# Patient Record
Sex: Male | Born: 1960 | Race: White | Hispanic: No | Marital: Single | Smoking: Current every day smoker
Health system: Southern US, Community
[De-identification: ages and names within clinical notes are randomized; demographics above are authoritative.]

## PROBLEM LIST (undated history)

## (undated) DIAGNOSIS — R911 Solitary pulmonary nodule: Secondary | ICD-10-CM

## (undated) DIAGNOSIS — R195 Other fecal abnormalities: Secondary | ICD-10-CM

## (undated) DIAGNOSIS — J439 Emphysema, unspecified: Secondary | ICD-10-CM

## (undated) HISTORY — PX: TONSILLECTOMY: SUR1361

## (undated) HISTORY — PX: HAND SURGERY: SHX662

## (undated) HISTORY — PX: SHOULDER SURGERY: SHX246

## (undated) HISTORY — DX: Solitary pulmonary nodule: R91.1

## (undated) HISTORY — DX: Other fecal abnormalities: R19.5

## (undated) HISTORY — DX: Emphysema, unspecified: J43.9

---

## 2012-01-04 ENCOUNTER — Encounter: Payer: Self-pay | Admitting: Osteopathic Medicine

## 2015-02-14 ENCOUNTER — Emergency Department (INDEPENDENT_AMBULATORY_CARE_PROVIDER_SITE_OTHER)
Admission: EM | Admit: 2015-02-14 | Discharge: 2015-02-14 | Disposition: A | Discharge status: Home / Self Care | Payer: BLUE CROSS/BLUE SHIELD | Source: Home / Self Care | Attending: Family Medicine | Admitting: Family Medicine

## 2015-02-14 ENCOUNTER — Encounter: Payer: Self-pay | Admitting: Emergency Medicine

## 2015-02-14 DIAGNOSIS — L03317 Cellulitis of buttock: Secondary | ICD-10-CM | POA: Diagnosis not present

## 2015-02-14 DIAGNOSIS — L03111 Cellulitis of right axilla: Secondary | ICD-10-CM

## 2015-02-14 DIAGNOSIS — Z23 Encounter for immunization: Secondary | ICD-10-CM | POA: Diagnosis not present

## 2015-02-14 DIAGNOSIS — L03112 Cellulitis of left axilla: Secondary | ICD-10-CM

## 2015-02-14 MED ORDER — DOXYCYCLINE HYCLATE 100 MG PO CAPS: 100.0000 mg | ORAL_CAPSULE | Freq: Two times a day (BID) | ORAL | Status: DC

## 2015-02-14 MED ORDER — INFLUENZA VAC SPLIT QUAD 0.5 ML IM SUSY
0.5000 mL | PREFILLED_SYRINGE | Freq: Once | INTRAMUSCULAR | Status: AC
  Administered 2015-02-14: 0.5 mL via INTRAMUSCULAR

## 2015-02-14 NOTE — ED Provider Notes (Signed)
CSN: [id number]     Arrival date & time 02/14/15  1047 History   First MD Initiated Contact with Patient 02/14/15 1116     Chief Complaint  Patient presents with  . Recurrent Skin Infections      HPI Comments: Patient reports that he had some axillary abscesses several months ago, resolved.  He has now developed a small tender spot in each axilla, and a sore area on his right medial buttock.  There has been no drainage, and no fevers, chills, and sweats.  Patient is a 54 y.o. male presenting with abscess. The history is provided by the patient.  Abscess Abscess location: both axillae and left buttock. Abscess quality: induration, painful and redness   Abscess quality: not draining, no fluctuance, no itching, no warmth and not weeping   Red streaking: no   Duration:  3 days Progression:  Worsening Pain details:    Quality:  Dull   Severity:  Mild   Duration:  3 days   Progression:  Worsening Chronicity:  Recurrent Context: not diabetes   Relieved by:  None tried Worsened by:  Nothing tried Ineffective treatments:  None tried Associated symptoms: no fatigue, no fever and no nausea   Risk factors: prior abscess     History reviewed. No pertinent past medical history. Past Surgical History  Procedure Laterality Date  . Tonsillectomy    . Shoulder surgery Right   . Hand surgery Left    History reviewed. No pertinent family history. Social History  Substance Use Topics  . Smoking status: Current Every Day Smoker  . Smokeless tobacco: Never Used  . Alcohol Use: Yes    Review of Systems  Constitutional: Negative for fever and fatigue.  Gastrointestinal: Negative for nausea.  All other systems reviewed and are negative.   Allergies  Review of patient's allergies indicates no known allergies.  Home Medications   Prior to Admission medications   Medication Sig Start Date End Date Taking? Authorizing Provider  aspirin 81 MG tablet Take 81 mg by mouth daily.   Yes  Historical Provider, MD  Multiple Vitamin (MULTIVITAMIN) capsule Take 1 capsule by mouth daily.   Yes Historical Provider, MD  doxycycline (VIBRAMYCIN) 100 MG capsule Take 1 capsule (100 mg total) by mouth 2 (two) times daily. Take with food. 02/14/15   Lattie Haw, MD   Meds Ordered and Administered this Visit  Medications - No data to display  BP 144/90 mmHg  Pulse 67  Temp(Src) 97.5 F (36.4 C) (Oral)  Resp 16  Ht 6' (1.829 m)  Wt 165 lb (74.844 kg)  BMI 22.37 kg/m2  SpO2 100% No data found.   Physical Exam  Constitutional: He is oriented to person, place, and time. He appears well-developed and well-nourished. No distress.  HENT:  Head: Normocephalic.  Mouth/Throat: Oropharynx is clear and moist.  Eyes: Conjunctivae are normal. Pupils are equal, round, and reactive to light.  Neck: Neck supple.  Cardiovascular: Normal heart sounds.   Pulmonary/Chest: Breath sounds normal.  Lymphadenopathy:    He has no cervical adenopathy.  Neurological: He is alert and oriented to person, place, and time.  Skin: Skin is warm and dry.     In each axilla is a 1cm diameter erythematous indurated area, tender to palpation but not fluctuant.  On the left buttock medially, as noted on diagram, is a 2cm diameter indurated tender erythematous area, not fluctuant.    Nursing note and vitals reviewed.   ED Course  Procedures  none  MDM   1. Cellulitis of left axilla   2. Cellulitis of axilla, right   3. Cellulitis of buttock, left    Begin doxycycline 100mg  BID for staph coverage. Begin warm soak or warm compress 3 to 4 times daily. Return if not improving about 4 days, or if symptoms worsen.  May need I & D    Lattie HawStephen A Seraphim Affinito, MD 02/19/15 1758

## 2015-02-14 NOTE — Discharge Instructions (Signed)
Begin warm soak or warm compress 3 to 4 times daily.   Cellulitis Cellulitis is an infection of the skin and the tissue beneath it. The infected area is usually red and tender. Cellulitis occurs most often in the arms and lower legs.  CAUSES  Cellulitis is caused by bacteria that enter the skin through cracks or cuts in the skin. The most common types of bacteria that cause cellulitis are staphylococci and streptococci. SIGNS AND SYMPTOMS   Redness and warmth.  Swelling.  Tenderness or pain.  Fever. DIAGNOSIS  Your health care provider can usually determine what is wrong based on a physical exam. Blood tests may also be done. TREATMENT  Treatment usually involves taking an antibiotic medicine. HOME CARE INSTRUCTIONS   Take your antibiotic medicine as directed by your health care provider. Finish the antibiotic even if you start to feel better.  Keep the infected arm or leg elevated to reduce swelling.  Apply a warm cloth to the affected area up to 4 times per day to relieve pain.  Take medicines only as directed by your health care provider.  Keep all follow-up visits as directed by your health care provider. SEEK MEDICAL CARE IF:   You notice red streaks coming from the infected area.  Your red area gets larger or turns dark in color.  Your bone or joint underneath the infected area becomes painful after the skin has healed.  Your infection returns in the same area or another area.  You notice a swollen bump in the infected area.  You develop new symptoms.  You have a fever. SEEK IMMEDIATE MEDICAL CARE IF:   You feel very sleepy.  You develop vomiting or diarrhea.  You have a general ill feeling (malaise) with muscle aches and pains.   This information is not intended to replace advice given to you by your health care provider. Make sure you discuss any questions you have with your health care provider.   Document Released: 12/29/2004 Document Revised:  12/10/2014 Document Reviewed: 06/06/2011 Elsevier Interactive Patient Education [employer] Elsevier Inc.

## 2015-02-14 NOTE — ED Notes (Signed)
Reports developing boils in both axilla and at gluteal fold over past few days. Had axillary boil few months ago, prior to current.

## 2015-02-20 ENCOUNTER — Telehealth: Payer: Self-pay | Admitting: *Deleted

## 2015-08-06 ENCOUNTER — Ambulatory Visit (INDEPENDENT_AMBULATORY_CARE_PROVIDER_SITE_OTHER): Payer: BLUE CROSS/BLUE SHIELD | Admitting: Family Medicine

## 2015-08-06 ENCOUNTER — Encounter: Payer: Self-pay | Admitting: Family Medicine

## 2015-08-06 VITALS — BP 142/81 | HR 76 | Wt 169.0 lb

## 2015-08-06 DIAGNOSIS — L723 Sebaceous cyst: Secondary | ICD-10-CM

## 2015-08-06 DIAGNOSIS — L089 Local infection of the skin and subcutaneous tissue, unspecified: Secondary | ICD-10-CM

## 2015-08-06 MED ORDER — DOXYCYCLINE HYCLATE 100 MG PO TABS: 100.0000 mg | ORAL_TABLET | Freq: Two times a day (BID) | ORAL | Status: DC

## 2015-08-06 NOTE — Patient Instructions (Signed)
Thank you for coming in today. Take doxycycline twice daily for infection.  Return as needed.   Perirectal Abscess An abscess is an infected area that contains a collection of pus. A perirectal abscess is an abscess that is near the opening of the anus or around the rectum. A perirectal abscess can cause a lot of pain, especially during bowel movements. CAUSES This condition is almost always caused by an infection that starts in an anal gland. RISK FACTORS This condition is more likely to develop in:  People with diabetes or inflammatory bowel disease.  People whose body defense system (immune system) is weak.  People who have anal sex.  People who have a sexually transmitted disease (STD).  People who have certain kinds of cancers, such as rectal carcinoma, leukemia, or lymphoma. SYMPTOMS The main symptom of this condition is pain. The pain may be a throbbing pain that gets worse during bowel movements. Other symptoms include:  Fever.  Swelling.  Redness.  Bleeding.  Constipation. DIAGNOSIS The condition is diagnosed with a physical exam. If the abscess is not visible, a health care provider may need to place a finger inside the rectum to find the abscess. Sometimes, imaging tests are done to determine the size and location of the abscess. These tests may include:  An ultrasound.  An MRI.  A CT scan. TREATMENT This condition is usually treated with incision and drainage surgery. Incision and drainage surgery involves making an incision over the abscess to drain the pus. Treatment may also involve antibiotic medicine, pain medicine, stool softeners, or laxatives. HOME CARE INSTRUCTIONS  Take medicines only as directed by your health care provider.  If you were prescribed an antibiotic, finish all of it even if you start to feel better.  To relieve pain, try sitting:  In a warm, shallow bath (sitz bath).  On a heating pad with the setting on low.  On an inflatable  donut-shaped cushion.  Follow any diet instructions as directed by your health care provider.  Keep all follow-up visits as directed by your health care provider. This is important. SEEK MEDICAL CARE IF:  Your abscess is bleeding.  You have pain, swelling, or redness that is getting worse.  You are constipated.  You feel ill.  You have muscle aches or chills.  You have a fever.  Your symptoms return after the abscess has healed.   This information is not intended to replace advice given to you by your health care provider. Make sure you discuss any questions you have with your health care provider.   Document Released: 03/18/2000 Document Revised: 12/10/2014 Document Reviewed: 01/29/2014 Elsevier Interactive Patient Education 2016 Elsevier Inc.   Epidermal Cyst An epidermal cyst is sometimes called a sebaceous cyst, epidermal inclusion cyst, or infundibular cyst. These cysts usually contain a substance that looks "pasty" or "cheesy" and may have a bad smell. This substance is a protein called keratin. Epidermal cysts are usually found on the face, neck, or trunk. They may also occur in the vaginal area or other parts of the genitalia of both men and women. Epidermal cysts are usually small, painless, slow-growing bumps or lumps that move freely under the skin. It is important not to try to pop them. This may cause an infection and lead to tenderness and swelling. CAUSES  Epidermal cysts may be caused by a deep penetrating injury to the skin or a plugged hair follicle, often associated with acne. SYMPTOMS  Epidermal cysts can become inflamed and cause:  Redness.  Tenderness.  Increased temperature of the skin over the bumps or lumps.  Grayish-white, bad smelling material that drains from the bump or lump. DIAGNOSIS  Epidermal cysts are easily diagnosed by your caregiver during an exam. Rarely, a tissue sample (biopsy) may be taken to rule out other conditions that may  resemble epidermal cysts. TREATMENT   Epidermal cysts often get better and disappear on their own. They are rarely ever cancerous.  If a cyst becomes infected, it may become inflamed and tender. This may require opening and draining the cyst. Treatment with antibiotics may be necessary. When the infection is gone, the cyst may be removed with minor surgery.  Small, inflamed cysts can often be treated with antibiotics or by injecting steroid medicines.  Sometimes, epidermal cysts become large and bothersome. If this happens, surgical removal in your caregiver's office may be necessary. HOME CARE INSTRUCTIONS  Only take over-the-counter or prescription medicines as directed by your caregiver.  Take your antibiotics as directed. Finish them even if you start to feel better. SEEK MEDICAL CARE IF:   Your cyst becomes tender, red, or swollen.  Your condition is not improving or is getting worse.  You have any other questions or concerns. MAKE SURE YOU:  Understand these instructions.  Will watch your condition.  Will get help right away if you are not doing well or get worse.   This information is not intended to replace advice given to you by your health care provider. Make sure you discuss any questions you have with your health care provider.   Document Released: 02/20/2004 Document Revised: 06/13/2011 Document Reviewed: 09/27/2010 Elsevier Interactive Patient Education [employer]

## 2015-08-06 NOTE — Progress Notes (Signed)
       Jeffery Callahan is a 55 y.o. male who [name] today for abscess. Patient has a painful tender area on his right gluteal cleft near his anus. This is been worsening over the last few days. He's tried some over-the-counter medicines for pain control which have not helped. He denies any fevers or chills nausea vomiting diarrhea or discharge. He denies any injury.   No past medical history on file. Past Surgical History  Procedure Laterality Date  . Tonsillectomy    . Shoulder surgery Right   . Hand surgery Left    Social History  Substance Use Topics  . Smoking status: Current Every Day Smoker  . Smokeless tobacco: Never Used  . Alcohol Use: Yes   family history is not on file.  ROS as above Medications: Current Outpatient Prescriptions  Medication Sig Dispense Refill  . ASHWAGANDHA PO Take by mouth.    Marland Kitchen. aspirin 81 MG tablet Take 81 mg by mouth daily.    . COCONUT OIL PO Take by mouth.    Marland Kitchen. KRILL OIL PO Take by mouth.    . Multiple Vitamin (MULTIVITAMIN) capsule Take 1 capsule by mouth daily.    Marland Kitchen. doxycycline (VIBRA-TABS) 100 MG tablet Take 1 tablet (100 mg total) by mouth 2 (two) times daily. 14 tablet 0   No current facility-administered medications for this visit.   No Known Allergies   Exam:  BP 142/81 mmHg  Pulse 76  Wt 169 lb (76.658 kg) Gen: Well NAD HEENT: EOMI,  MMM Lungs: Normal work of breathing. CTABL Heart: RRR no MRG Abd: NABS, Soft. Nondistended, Nontender Exts: Brisk capillary refill, warm and well perfused.  Skin: Right buttocks erythematous tender indurated area near the anus on the gluteal cleft with a central area of fluctuance. No pus is expressible.  Abscess incision and drainage. Consent obtained and timeout performed. Skin cleaned with alcohol, and cold spray applied. 8 mL mL of lidocaine next 50% 50% with bupivacaine injected  achieving good anesthesia. Skin was again cleaned with alcohol. A sharp incision was made to the area of fluctuance. The incision was widened and pus was expressed.  Blunt dissection was used to break up loculations. Further pus was expressed. Patient tolerated the procedure well. A dressing was applied   No results found for this or any previous visit (from the past 24 hour(s)). No results found.   55 year old male with skin abscess status post incision and drainage. Plan to treat with oral doxycycline. Recheck and return as needed.

## 2015-08-10 ENCOUNTER — Encounter: Payer: Self-pay | Admitting: Family Medicine

## 2015-08-10 ENCOUNTER — Ambulatory Visit (INDEPENDENT_AMBULATORY_CARE_PROVIDER_SITE_OTHER): Payer: BLUE CROSS/BLUE SHIELD | Admitting: Family Medicine

## 2015-08-10 VITALS — BP 158/91 | HR 63 | Wt 172.0 lb

## 2015-08-10 DIAGNOSIS — L723 Sebaceous cyst: Secondary | ICD-10-CM

## 2015-08-10 DIAGNOSIS — L089 Local infection of the skin and subcutaneous tissue, unspecified: Secondary | ICD-10-CM

## 2015-08-10 MED ORDER — HYDROCODONE-ACETAMINOPHEN 5-325 MG PO TABS: 1.0000 | ORAL_TABLET | Freq: Four times a day (QID) | ORAL | Status: DC | PRN

## 2015-08-10 MED ORDER — CEFUROXIME AXETIL 500 MG PO TABS: 500.0000 mg | ORAL_TABLET | Freq: Two times a day (BID) | ORAL | Status: DC

## 2015-08-10 NOTE — Assessment & Plan Note (Signed)
Clinically he appears well. Will add Ceftin antibiotics. Continue doxycycline. Small amount of Norco. Follow-up if not improving or if worsening. Workup provided.

## 2015-08-10 NOTE — Progress Notes (Signed)
       Jeffery Callahan is a 55 y.o. male who [facility] today for follow-up abscess. Patient was seen last week for a perirectal abscess/infected sebaceous cyst. This was incised and drained. He was given empiric doxycycline antibiotics and feels pretty well. He notes the pain is improving but is still present. He notes a mild subjective fevers and chills. No vomiting or diarrhea. He took some old leftover Norco for pain control which helped a lot. He notes pain with sitting.   No past medical history on file. Past Surgical History  Procedure Laterality Date  . Tonsillectomy    . Shoulder surgery Right   . Hand surgery Left    Social History  Substance Use Topics  . Smoking status: Current Every Day Smoker  . Smokeless tobacco: Never Used  . Alcohol Use: Yes   family history is not on file.  ROS as above Medications: Current Outpatient Prescriptions  Medication Sig Dispense Refill  . ASHWAGANDHA PO Take by mouth.    Marland Kitchen. aspirin 81 MG tablet Take 81 mg by mouth daily.    . cefUROXime (CEFTIN) 500 MG tablet Take 1 tablet (500 mg total) by mouth 2 (two) times daily with a meal. 14 tablet 0  . COCONUT OIL PO Take by mouth.    . doxycycline (VIBRA-TABS) 100 MG tablet Take 1 tablet (100 mg total) by mouth 2 (two) times daily. 14 tablet 0  . HYDROcodone-acetaminophen (NORCO) 5-325 MG tablet Take 1 tablet by mouth every 6 (six) hours as needed for moderate pain. 10 tablet 0  . KRILL OIL PO Take by mouth.    . Multiple Vitamin (MULTIVITAMIN) capsule Take 1 capsule by mouth daily.     No current facility-administered medications for this visit.   No Known Allergies   Exam:  BP 158/91 mmHg  Pulse 63  Wt 172 lb (78.019 kg) Gen: Well NAD HEENT: EOMI,  MMM Lungs: Normal work of breathing. CTABL Heart: RRR no MRG Abd: NABS, Soft. Nondistended, Nontender Exts: Brisk capillary  refill, warm and well perfused.  Skin: Right gluteal cleft mildly erythematous. Without induration or fluctuance. Mildly tender.  No results found for this or any previous visit (from the past 24 hour(s)). No results found.   Please see individual assessment and plan sections.

## 2015-08-10 NOTE — Patient Instructions (Signed)
Thank you for coming in today. Return if not better or if worsening.  Continue Doxycycline and Add Ceftin twice daily.  Use Norco for pain sparingly.

## 2015-09-21 ENCOUNTER — Encounter: Payer: Self-pay | Admitting: *Deleted

## 2015-09-21 ENCOUNTER — Emergency Department (INDEPENDENT_AMBULATORY_CARE_PROVIDER_SITE_OTHER)
Admission: EM | Admit: 2015-09-21 | Discharge: 2015-09-21 | Disposition: A | Discharge status: Home / Self Care | Payer: BLUE CROSS/BLUE SHIELD | Source: Home / Self Care | Attending: Family Medicine | Admitting: Family Medicine

## 2015-09-21 DIAGNOSIS — R238 Other skin changes: Secondary | ICD-10-CM

## 2015-09-21 DIAGNOSIS — L988 Other specified disorders of the skin and subcutaneous tissue: Secondary | ICD-10-CM | POA: Diagnosis not present

## 2015-09-21 MED ORDER — DOXYCYCLINE HYCLATE 100 MG PO CAPS
100.0000 mg | ORAL_CAPSULE | Freq: Two times a day (BID) | ORAL | Status: DC
Start: 2015-09-21 — End: 2015-09-28

## 2015-09-21 NOTE — ED Provider Notes (Signed)
CSN: [id number]     Arrival date & time 09/21/15  1538 History   First MD Initiated Contact with Patient 09/21/15 1704     Chief Complaint  Patient presents with  . Insect Bite      HPI Comments: Patient noticed a blister with clear fluid on his right 4th toe this morning.  He recalls no insect bite or contact with allergens.  No fevers, chills, and sweats.  He feels well otherwise.  The history is provided by the patient and the spouse.    History reviewed. No pertinent past medical history. Past Surgical History  Procedure Laterality Date  . Tonsillectomy    . Shoulder surgery Right   . Hand surgery Left    History reviewed. No pertinent family history. Social History  Substance Use Topics  . Smoking status: Current Every Day Smoker  . Smokeless tobacco: Never Used  . Alcohol Use: Yes    Review of Systems  Constitutional: Negative.   HENT: Negative.   Eyes: Negative.   Respiratory: Negative.   Cardiovascular: Negative.   Genitourinary: Negative.   Musculoskeletal: Negative for myalgias and arthralgias.  Skin: Positive for rash.  Neurological: Negative for headaches.    Allergies  Review of patient's allergies indicates no known allergies.  Home Medications   Prior to Admission medications   Medication Sig Start Date End Date Taking? Authorizing Provider  ASHWAGANDHA PO Take by mouth.    Historical Provider, MD  aspirin 81 MG tablet Take 81 mg by mouth daily.    Historical Provider, MD  COCONUT OIL PO Take by mouth.    Historical Provider, MD  doxycycline (VIBRAMYCIN) 100 MG capsule Take 1 capsule (100 mg total) by mouth 2 (two) times daily. Take with food. 09/21/15   Lattie HawStephen A Salmaan Patchin, MD  HYDROcodone-acetaminophen (NORCO) 5-325 MG tablet Take 1 tablet by mouth every 6 (six) hours as needed for moderate pain. 08/10/15   Rodolph BongEvan S Corey, MD  KRILL OIL PO Take by mouth.    Historical Provider, MD  Multiple Vitamin (MULTIVITAMIN) capsule Take 1 capsule by mouth daily.     Historical Provider, MD   Meds Ordered and Administered this Visit  Medications - No data to display  BP 150/84 mmHg  Pulse 56  Temp(Src) 98.1 F (36.7 C) (Oral)  Wt 168 lb (76.204 kg)  SpO2 100% No data found.   Physical Exam  Constitutional: He is oriented to person, place, and time. He appears well-developed and well-nourished.  HENT:  Head: Normocephalic.  Mouth/Throat: Oropharynx is clear and moist.  Eyes: Conjunctivae are normal. Pupils are equal, round, and reactive to light.  Musculoskeletal: He exhibits no edema.       Feet:  Dorsum of right 4th toe has a 5mm diameter bulla containing clear fluid.  Dorsum of left foot has several tiny vesicles as noted on diagram.  No lesions on hands or elsewherer    Neurological: He is alert and oriented to person, place, and time.  Skin: Skin is warm and dry.  Nursing note and vitals reviewed.    ED Course  Procedures    MDM   1. Skin bulla; ?insect bites.   Symptoms do not suggest hand/foot/mouth disease. Begin empiric doxycycline 100mg  BID for staph coverage. Apply bandage if blister breaks.   Followup with family doctor if fever, swelling, increasing redness develop.    Lattie HawStephen A Jahmiyah Dullea, MD 09/21/15 1725

## 2015-09-21 NOTE — ED Notes (Signed)
Pt c/o possible bite to his right 4th toe that has blistered today with surrounding redness. He walked around [place] over the weekend and mowed @ home yesterday. Also has several bites to left foot. Otherwise asymptomatic.

## 2015-09-21 NOTE — Discharge Instructions (Signed)
Apply bandage if blister breaks.   Followup with family doctor if fever, swelling, increasing redness develop.

## 2015-09-23 ENCOUNTER — Ambulatory Visit (INDEPENDENT_AMBULATORY_CARE_PROVIDER_SITE_OTHER): Payer: BLUE CROSS/BLUE SHIELD | Admitting: Family Medicine

## 2015-09-23 ENCOUNTER — Encounter: Payer: Self-pay | Admitting: Family Medicine

## 2015-09-23 VITALS — BP 133/69 | HR 69 | Wt 167.0 lb

## 2015-09-23 DIAGNOSIS — Z23 Encounter for immunization: Secondary | ICD-10-CM | POA: Diagnosis not present

## 2015-09-23 DIAGNOSIS — R238 Other skin changes: Secondary | ICD-10-CM

## 2015-09-23 LAB — CBC
HEMATOCRIT: 44.9 % (ref 38.5–50.0)
HEMOGLOBIN: 15.6 g/dL (ref 13.2–17.1)
MCH: 33.3 pg — ABNORMAL HIGH (ref 27.0–33.0)
MCHC: 34.7 g/dL (ref 32.0–36.0)
MCV: 95.7 fL (ref 80.0–100.0)
MPV: 11.2 fL (ref 7.5–12.5)
Platelets: 192 10*3/uL (ref 140–400)
RBC: 4.69 MIL/uL (ref 4.20–5.80)
RDW: 12.6 % (ref 11.0–15.0)
WBC: 6.1 10*3/uL (ref 3.8–10.8)

## 2015-09-23 LAB — COMPREHENSIVE METABOLIC PANEL
ALK PHOS: 85 U/L (ref 40–115)
ALT: 12 U/L (ref 9–46)
AST: 17 U/L (ref 10–35)
Albumin: 4.1 g/dL (ref 3.6–5.1)
BUN: 7 mg/dL (ref 7–25)
CALCIUM: 9.2 mg/dL (ref 8.6–10.3)
CO2: 29 mmol/L (ref 20–31)
Chloride: 105 mmol/L (ref 98–110)
Creat: 0.87 mg/dL (ref 0.70–1.33)
Glucose, Bld: 89 mg/dL (ref 65–99)
POTASSIUM: 4.4 mmol/L (ref 3.5–5.3)
Sodium: 141 mmol/L (ref 135–146)
TOTAL PROTEIN: 6.4 g/dL (ref 6.1–8.1)
Total Bilirubin: 0.5 mg/dL (ref 0.2–1.2)

## 2015-09-23 LAB — RHEUMATOID FACTOR

## 2015-09-23 MED ORDER — TRIAMCINOLONE ACETONIDE 0.5 % EX OINT: 1.0000 "application " | TOPICAL_OINTMENT | Freq: Two times a day (BID) | CUTANEOUS | Status: DC

## 2015-09-23 MED ORDER — CEPHALEXIN 500 MG PO CAPS: 500.0000 mg | ORAL_CAPSULE | Freq: Four times a day (QID) | ORAL | Status: DC

## 2015-09-23 NOTE — Progress Notes (Signed)
       Jeffery Callahan is a 55 y.o. male who [name] today for painful blisters. Patient developed painful blisters on both feet last week after walking around and [place] in [place]. He was seen in urgent care and given doxycycline which did not help. He notes the blisters are painful and not itchy. No fevers or chills body aches or flulike illness. He feels well otherwise. He notes the blisters are painful enough that he has difficulty walking at work with boots on.   No past medical history on file. Past Surgical History  Procedure Laterality Date  . Tonsillectomy    . Shoulder surgery Right   . Hand surgery Left    Social History  Substance Use Topics  . Smoking status: Current Every Day Smoker  . Smokeless tobacco: Never Used  . Alcohol Use: Yes   family history is not on file.  ROS as above:  Medications: Current Outpatient Prescriptions  Medication Sig Dispense Refill  . ASHWAGANDHA PO Take by mouth.    Marland Kitchen. aspirin 81 MG tablet Take 81 mg by mouth daily.    . cephALEXin (KEFLEX) 500 MG capsule Take 1 capsule (500 mg total) by mouth 4 (four) times daily. 28 capsule 0  . COCONUT OIL PO Take by mouth.    . doxycycline (VIBRAMYCIN) 100 MG capsule Take 1 capsule (100 mg total) by mouth 2 (two) times daily. Take with food. 14 capsule 0  . KRILL OIL PO Take by mouth.    . Multiple Vitamin (MULTIVITAMIN) capsule Take 1 capsule by mouth daily.    Marland Kitchen. triamcinolone ointment (KENALOG) 0.5 % Apply 1 application topically 2 (two) times daily. To foot rash 30 g 3   No current facility-administered medications for this visit.   No Known Allergies   Exam:  BP 133/69 mmHg  Pulse 69  Wt 167 lb (75.751 kg)  SpO2 100% Gen: Well NAD HEENT: EOMI,  MMM Lungs: Normal work of breathing. CTABL Heart: RRR no MRG Abd: NABS, Soft. Nondistended, Nontender Exts:  Brisk capillary refill, warm and well perfused.  Skin: Multiple erythematous blisters on feet bilaterally. Tender with no surrounding skin area of the erythema or induration.  No results found for this or any previous visit (from the past 24 hour(s)). No results found.    Assessment and Plan: 55 y.o. male with blisters: Unclear etiology. The differential is broad including infections arthropod bites parasites or autoimmune. Plan to obtain basic laboratory workup including CBC CMP sedimentation rate and mild rheumatologic workup. Treat empirically with Keflex in addition to doxycycline and topical triamcinolone ointment. Recheck next week if not better.  Discussed warning signs or symptoms. Please see discharge instructions. Patient expresses understanding.  Tdap Given prior to discharge.

## 2015-09-23 NOTE — Patient Instructions (Signed)
Thank you for coming in today. Take the other antibiotic  Apply the ointment Return if not better.   Get labs.

## 2015-09-24 LAB — SEDIMENTATION RATE: SED RATE: 3 mm/h (ref 0–20)

## 2015-09-24 LAB — ANA: Anti Nuclear Antibody(ANA): NEGATIVE

## 2015-09-24 LAB — CYCLIC CITRUL PEPTIDE ANTIBODY, IGG

## 2015-09-24 NOTE — Progress Notes (Signed)
Quick Note:  Labs were normal. I don't know what was causing the blisters. ______

## 2015-09-28 ENCOUNTER — Ambulatory Visit (INDEPENDENT_AMBULATORY_CARE_PROVIDER_SITE_OTHER): Payer: BLUE CROSS/BLUE SHIELD | Admitting: Family Medicine

## 2015-09-28 ENCOUNTER — Encounter: Payer: Self-pay | Admitting: Family Medicine

## 2015-09-28 VITALS — BP 153/84 | HR 66 | Wt 165.0 lb

## 2015-09-28 DIAGNOSIS — R238 Other skin changes: Secondary | ICD-10-CM

## 2015-09-28 NOTE — Patient Instructions (Signed)
Thank you for coming in today. I am not sure what this is.  Return if not better.   We will call with results.

## 2015-09-28 NOTE — Progress Notes (Signed)
       Jeffery Callahan is a 55 y.o. male who [name] Medicine today for follow-up blisters. Patient was seen last week for painful blisters on his feet. Etiology was unclear. He was treated with doxycycline and Keflex. He notes he is improving with reducing pain. He had an episode of fevers and chills but notes that this has resolved. He feels fine with mild pain only.   No past medical history on file. Past Surgical History  Procedure Laterality Date  . Tonsillectomy    . Shoulder surgery Right   . Hand surgery Left    Social History  Substance Use Topics  . Smoking status: Current Every Day Smoker  . Smokeless tobacco: Never Used  . Alcohol Use: Yes   family history is not on file.  ROS as above:  Medications: Current Outpatient Prescriptions  Medication Sig Dispense Refill  . ASHWAGANDHA PO Take by mouth.    Marland Kitchen. aspirin 81 MG tablet Take 81 mg by mouth daily.    . cephALEXin (KEFLEX) 500 MG capsule Take 1 capsule (500 mg total) by mouth 4 (four) times daily. 28 capsule 0  . COCONUT OIL PO Take by mouth.    Marland Kitchen. KRILL OIL PO Take by mouth.    . Multiple Vitamin (MULTIVITAMIN) capsule Take 1 capsule by mouth daily.    Marland Kitchen. triamcinolone ointment (KENALOG) 0.5 % Apply 1 application topically 2 (two) times daily. To foot rash 30 g 3   No current facility-administered medications for this visit.   No Known Allergies   Exam:  BP 153/84 mmHg  Pulse 66  Wt 165 lb (74.844 kg) Gen: Well NAD Feet bilaterally with erythematous papules some vesicles some blisters. Nontender.  The blister on the right fourth toe was cleaned with chlorhexidine and a small cut was made. Clear fluid was drained and cultured.  No results found for this or any previous visit (from the past 24 hour(s)). No results found.    Assessment and Plan: 55 y.o. male with resolving painful  blisters. Unclear etiology. I'm not sure if this is infectious or arthropod bite or rheumatologic. His rheumatologic workup last week was normal. He seems to be improving. Culture of the fluid from one of the blisters is pending which may be helpful. Patient will continue on his antibiotic course and return if worsening or not improving. If worsening or not improving would biopsy in refer to dermatology.  Discussed warning signs or symptoms. Please see discharge instructions. Patient expresses understanding.

## 2015-10-01 LAB — WOUND CULTURE
Gram Stain: NONE SEEN
Gram Stain: NONE SEEN
Organism ID, Bacteria: NO GROWTH

## 2015-10-02 ENCOUNTER — Telehealth: Payer: Self-pay

## 2015-10-02 NOTE — Telephone Encounter (Signed)
Pt called for results of his recent labs. Please advise.

## 2015-10-02 NOTE — Telephone Encounter (Signed)
Left detailed vm advising pt

## 2015-10-02 NOTE — Telephone Encounter (Signed)
The culture so far is not growing any bacteria but it is still not finalized. It typically becomes finalized about 5 days after it was sent to the lab.

## 2015-10-22 ENCOUNTER — Ambulatory Visit (INDEPENDENT_AMBULATORY_CARE_PROVIDER_SITE_OTHER): Payer: BLUE CROSS/BLUE SHIELD | Admitting: Family Medicine

## 2015-10-22 ENCOUNTER — Encounter: Payer: Self-pay | Admitting: Family Medicine

## 2015-10-22 VITALS — BP 146/86 | HR 72 | Wt 167.0 lb

## 2015-10-22 DIAGNOSIS — L989 Disorder of the skin and subcutaneous tissue, unspecified: Secondary | ICD-10-CM | POA: Diagnosis not present

## 2015-10-22 DIAGNOSIS — R21 Rash and other nonspecific skin eruption: Secondary | ICD-10-CM | POA: Insufficient documentation

## 2015-10-22 MED ORDER — CLOBETASOL PROPIONATE 0.05 % EX OINT: 1.0000 "application " | TOPICAL_OINTMENT | Freq: Two times a day (BID) | CUTANEOUS | Status: DC

## 2015-10-22 NOTE — Progress Notes (Signed)
       Jeffery Callahan is a 55 y.o. male who [facility]: Primary Care Sports Medicine today for follow-up foot rash. Patient was seen initially about a month ago for blisters on his feet bilaterally thought to be due to bug bites. He was treated with antibiotics and topical moderate potency steroids with only marginal improvement. He notes specifically a lesion on the dorsal second toe is still significantly irritated and painful at times. He's been using antibiotic ointment which has not helped.  He notes that he has returned to work and his symptoms seem to worsen. He suspects that spending 10 hours a day and hot sweaty work boots is making this problem worse. He does not think he is able to work currently.     No past medical history on file. Past Surgical History  Procedure Laterality Date  . Tonsillectomy    . Shoulder surgery Right   . Hand surgery Left    Social History  Substance Use Topics  . Smoking status: Current Every Day Smoker  . Smokeless tobacco: Never Used  . Alcohol Use: Yes   family history is not on file.  ROS as above:  Medications: Current Outpatient Prescriptions  Medication Sig Dispense Refill  . ASHWAGANDHA PO Take by mouth.    Marland Kitchen. aspirin 81 MG tablet Take 81 mg by mouth daily.    . COCONUT OIL PO Take by mouth.    Marland Kitchen. KRILL OIL PO Take by mouth.    . Multiple Vitamin (MULTIVITAMIN) capsule Take 1 capsule by mouth daily.    . clobetasol ointment (TEMOVATE) 0.05 % Apply 1 application topically 2 (two) times daily. 60 g 0   No current facility-administered medications for this visit.   No Known Allergies   Exam:  BP 146/86 mmHg  Pulse 72  Wt 167 lb (75.751 kg) Gen: Well NAD Right foot: Second toe erythematous linear lesion with some vesicles and ulcerations present. Tender to touch.  No results found for this or any previous visit (from the past 24  hour(s)). No results found.    Assessment and Plan: 55 y.o. male with skin lesion: Unclear etiology. I suspect this is an autoimmune issue as he has not improved his own expect with a more external self-limiting condition. Plan for trial of high potency topical steroids and refer to dermatology. Return sooner if needed. Work note provided. We'll fill out short-term disability and family forms as needed.  Discussed warning signs or symptoms. Please see discharge instructions. Patient expresses understanding.

## 2015-10-22 NOTE — Patient Instructions (Signed)
Thank you for coming in today. You should hear from dermatology soon.  Apply the new cream to the toes.

## 2015-11-06 ENCOUNTER — Telehealth: Payer: Self-pay

## 2015-11-06 ENCOUNTER — Encounter: Payer: Self-pay | Admitting: Family Medicine

## 2015-11-06 NOTE — Telephone Encounter (Signed)
Pt notified that letter has been placed up front for pickup.

## 2015-11-06 NOTE — Telephone Encounter (Signed)
Pt called stating that he has slightly improved and feels well enough to return to work. Pt called requesting an updated work note permitting him to return to work with light duty restrictions, working for 4-6 hrs from  11/09/15-11/22/15. Please advise.

## 2015-11-06 NOTE — Telephone Encounter (Signed)
Done

## 2015-11-17 ENCOUNTER — Encounter: Payer: Self-pay | Admitting: Family Medicine

## 2015-11-17 ENCOUNTER — Telehealth: Payer: Self-pay

## 2015-11-17 NOTE — Telephone Encounter (Signed)
Letter written and ready for pick up.

## 2015-11-18 ENCOUNTER — Telehealth: Payer: Self-pay

## 2015-11-18 ENCOUNTER — Encounter: Payer: Self-pay | Admitting: Family Medicine

## 2015-11-18 NOTE — Telephone Encounter (Signed)
Notified patient that letter will be at front desk.

## 2015-11-18 NOTE — Telephone Encounter (Signed)
Well spotted. I corrected the letter. It is ready for pickup.

## 2016-03-11 ENCOUNTER — Telehealth: Payer: Self-pay | Admitting: Family Medicine

## 2016-03-11 NOTE — Telephone Encounter (Signed)
Note received from disability company regarding a statement needed about return to work. I called and left a message with someone at home to call me back as well as on his cell phone voicemail. I need to know if the patient can return to work full time or part-time or if he has any limitations. If he has return to work full-time at the limitations on happy to write a note saying this. However if he has limitations at work patient will have to come in for further information to help fill this form out.

## 2016-03-11 NOTE — Telephone Encounter (Signed)
Pt called and provided this info. Form faxed to [employer] @ [phone].

## 2016-04-12 ENCOUNTER — Telehealth: Payer: Self-pay | Admitting: Osteopathic Medicine

## 2016-04-12 DIAGNOSIS — R7309 Other abnormal glucose: Secondary | ICD-10-CM

## 2016-04-12 DIAGNOSIS — Z0000 Encounter for general adult medical examination without abnormal findings: Secondary | ICD-10-CM

## 2016-04-12 NOTE — Telephone Encounter (Signed)
Pt has a cpe scheduled for 1/22 and wants lab order sent down on 1/17.  Thank you!

## 2016-04-13 NOTE — Telephone Encounter (Signed)
Orders are in place, he should be able to go down to the lab at his convenience.

## 2016-04-25 ENCOUNTER — Ambulatory Visit (INDEPENDENT_AMBULATORY_CARE_PROVIDER_SITE_OTHER): Payer: BLUE CROSS/BLUE SHIELD | Admitting: Osteopathic Medicine

## 2016-04-25 ENCOUNTER — Encounter: Payer: Self-pay | Admitting: Osteopathic Medicine

## 2016-04-25 VITALS — BP 135/80 | HR 73 | Ht 72.0 in | Wt 168.0 lb

## 2016-04-25 DIAGNOSIS — Z0000 Encounter for general adult medical examination without abnormal findings: Secondary | ICD-10-CM

## 2016-04-25 DIAGNOSIS — Z23 Encounter for immunization: Secondary | ICD-10-CM | POA: Diagnosis not present

## 2016-04-25 DIAGNOSIS — Z1211 Encounter for screening for malignant neoplasm of colon: Secondary | ICD-10-CM | POA: Diagnosis not present

## 2016-04-25 NOTE — Progress Notes (Signed)
HPI: Jeffery Callahan is a 56 y.o. male  who [facility] today, 04/26/16,  for chief complaint of:  Chief Complaint  Patient presents with  . Establish Care    annual    Here for annual physical - see below for review of preventive care.   Past medical, surgical, social and family history reviewed: Patient Active Problem List   Diagnosis Date Noted  . Skin lesion 10/22/2015  . Blisters of multiple sites 09/23/2015  . Infected sebaceous cyst 08/06/2015   Past Surgical History:  Procedure Laterality Date  . HAND SURGERY Left   . SHOULDER SURGERY Right   . TONSILLECTOMY     Social History  Substance Use Topics  . Smoking status: Current Every Day Smoker  . Smokeless tobacco: Never Used  . Alcohol use Yes   Family History  Problem Relation Age of Onset  . Depression Mother   . Cancer Maternal Aunt   . Stroke Paternal Uncle   . Cancer Maternal Grandmother      Current medication list and allergy/intolerance information reviewed:   Current Outpatient Prescriptions on File Prior to Visit  Medication Sig Dispense Refill  . ASHWAGANDHA PO Take by mouth.    Marland Kitchen. aspirin 81 MG tablet Take 81 mg by mouth daily.    . COCONUT OIL PO Take by mouth.    Marland Kitchen. KRILL OIL PO Take by mouth.    . Multiple Vitamin (MULTIVITAMIN) capsule Take 1 capsule by mouth daily.     No current facility-administered medications on file prior to visit.    No Known Allergies    Review of Systems:  Constitutional: No recent illness  HEENT: No  headache, no vision change  Cardiac: No  chest pain, No  pressure, No palpitations  Respiratory:  No  shortness of breath. No  Cough  Gastrointestinal: No  abdominal pain, no change on bowel habits  Musculoskeletal: No new myalgia/arthralgia  Skin: No  Rash  Hem/Onc: No  easy bruising/bleeding, No  abnormal lumps/bumps  Neurologic: No  weakness, No  Dizziness  Psychiatric: No  concerns with depression, No   concerns with anxiety  Exam:  BP 135/80   Pulse 73   Ht 6' (1.829 m)   Wt 168 lb (76.2 kg)   BMI 22.78 kg/m   Constitutional: VS see above. General Appearance: alert, well-developed, well-nourished, NAD  Eyes:  EOMI, PERRLNormal lids and conjunctive, non-icteric sclera  Ears, Nose, Mouth, Throat: MMM, Normal external inspection ears/nares/mouth/lips/gums.Normal TM b/l,   Neck: No masses, trachea midline. no thyromegaly or lymphadenopathy  Respiratory: Normal respiratory effort. no wheeze, no rhonchi, no rales  Cardiovascular: S1/S2 normal, no murmur, no rub/gallop auscultated. RRR.   Musculoskeletal: Gait normal. Symmetric and independent movement of all extremities  Neurological: Normal balance/coordination. No tremor.  Gastro: nontender, nondistended, BS wnl x4  Skin: warm, dry, intact.   Psychiatric: Normal judgment/insight. Normal mood and affect. Oriented x3.    ASSESSMENT/PLAN: need to get labs, these were ordered but he was unable to come to lab d/t snow closure. Pneumovax given in smoker.   Annual physical exam - Plan: Ambulatory Referral for Lung Cancer Scre, Ambulatory referral to Gastroenterology  Need for 23-polyvalent pneumococcal polysaccharide vaccine - Plan: Pneumococcal polysaccharide vaccine 23-valent greater than or equal to 2yo subcutaneous/IM   MALE PREVENTIVE CARE  updated 04/26/16  ANNUAL SCREENING/COUNSELING  Any changes to health in the past year? no  Diet/Exercise - HEALTHY HABITS DISCUSSED TO DECREASE CV RISK -  supplements as noted above,  History  Smoking Status  . Current Every Day Smoker  Smokeless Tobacco  . Never Used   History  Alcohol Use  . Yes   Depression screen PHQ 2/9 04/26/2016  Decreased Interest 0  Down, Depressed, Hopeless 0  PHQ - 2 Score 0    SEXUAL/REPRODUCTIVE HEALTH  Sexually active in the past year? - Yes with male.  STI testing needed/desired today? - no  Any concerns with testosterone/libido? -  no  INFECTIOUS DISEASE SCREENING  HIV - does not need  GC/CT - does not need  HepC - does not need  TB - does not need  CANCER SCREENING  Lung - USPSTF: 55-80yo w/ 30 py hx unless quit w/in 91yr - needs  Colon - needs  Prostate - does not need  OTHER DISEASE SCREENING  Lipid - needs  DM2 - needs   ADULT VACCINATION  Influenza - annual vaccine recommended  Td - booster every 10 years   Zoster - option at 28, yes at 60+   PCV13 - was not indicated  PPSV23 - was given Immunization History  Administered Date(s) Administered  . Influenza,inj,Quad PF,36+ Mos 02/14/2015  . Influenza-Unspecified 01/08/2016  . Pneumococcal Polysaccharide-23 04/25/2016  . Tdap 09/23/2015      Visit summary with medication list and pertinent instructions was printed for patient to review. All questions at time of visit were answered - patient instructed to contact office with any additional concerns. ER/RTC precautions were reviewed with the patient. Follow-up plan: Return in about 1 year (around 04/25/2017) for ANNUAL PHYSICAL, SOONER IF NEEDED.

## 2016-04-26 ENCOUNTER — Encounter: Payer: Self-pay | Admitting: Osteopathic Medicine

## 2016-04-26 LAB — CBC WITH DIFFERENTIAL/PLATELET
BASOS PCT: 1 %
Basophils Absolute: 66 cells/uL (ref 0–200)
EOS PCT: 2 %
Eosinophils Absolute: 132 cells/uL (ref 15–500)
HEMATOCRIT: 45.2 % (ref 38.5–50.0)
Hemoglobin: 15.6 g/dL (ref 13.2–17.1)
LYMPHS ABS: 2244 {cells}/uL (ref 850–3900)
Lymphocytes Relative: 34 %
MCH: 33.5 pg — ABNORMAL HIGH (ref 27.0–33.0)
MCHC: 34.5 g/dL (ref 32.0–36.0)
MCV: 97 fL (ref 80.0–100.0)
MONO ABS: 528 {cells}/uL (ref 200–950)
MPV: 10.9 fL (ref 7.5–12.5)
Monocytes Relative: 8 %
NEUTROS ABS: 3630 {cells}/uL (ref 1500–7800)
NEUTROS PCT: 55 %
Platelets: 226 10*3/uL (ref 140–400)
RBC: 4.66 MIL/uL (ref 4.20–5.80)
RDW: 12.6 % (ref 11.0–15.0)
WBC: 6.6 10*3/uL (ref 3.8–10.8)

## 2016-04-27 LAB — COMPLETE METABOLIC PANEL WITH GFR
ALT: 13 U/L (ref 9–46)
AST: 18 U/L (ref 10–35)
Albumin: 4.1 g/dL (ref 3.6–5.1)
Alkaline Phosphatase: 77 U/L (ref 40–115)
BUN: 12 mg/dL (ref 7–25)
CO2: 27 mmol/L (ref 20–31)
CREATININE: 0.91 mg/dL (ref 0.70–1.33)
Calcium: 9.2 mg/dL (ref 8.6–10.3)
Chloride: 106 mmol/L (ref 98–110)
GFR, Est African American: >89 mL/min (ref >=60)
GFR, Est Non African American: >89 mL/min (ref >=60)
GLUCOSE: 106 mg/dL — AB (ref 65–99)
POTASSIUM: 4.3 mmol/L (ref 3.5–5.3)
SODIUM: 140 mmol/L (ref 135–146)
Total Bilirubin: 0.6 mg/dL (ref 0.2–1.2)
Total Protein: 6.5 g/dL (ref 6.1–8.1)

## 2016-04-27 LAB — LIPID PANEL
Cholesterol: 168 mg/dL (ref <200)
HDL: 34 mg/dL — ABNORMAL LOW (ref >40)
LDL CALC: 104 mg/dL — AB (ref <100)
TRIGLYCERIDES: 152 mg/dL — AB (ref <150)
Total CHOL/HDL Ratio: 4.9 Ratio (ref <5.0)
VLDL: 30 mg/dL (ref <30)

## 2016-04-27 LAB — PSA: PSA: 2 ng/mL (ref <=4.0)

## 2016-04-27 LAB — HIV ANTIBODY (ROUTINE TESTING W REFLEX): HIV 1&2 Ab, 4th Generation: NONREACTIVE

## 2016-04-27 LAB — HEPATITIS C ANTIBODY: HCV AB: NEGATIVE

## 2016-04-27 NOTE — Addendum Note (Signed)
Addended by: Deirdre PippinsALEXANDER, Talma Aguillard M on: 04/27/2016 04:47 PM   Modules accepted: Orders

## 2016-04-28 ENCOUNTER — Telehealth: Payer: Self-pay

## 2016-04-28 DIAGNOSIS — Z87891 Personal history of nicotine dependence: Secondary | ICD-10-CM | POA: Insufficient documentation

## 2016-04-28 DIAGNOSIS — Z720 Tobacco use: Secondary | ICD-10-CM

## 2016-04-28 NOTE — Addendum Note (Signed)
Addended by: Pixie CasinoUNNINGHAM, RHONDA C on: 04/28/2016 04:52 PM   Modules accepted: Orders

## 2016-04-28 NOTE — Telephone Encounter (Signed)
Patient called stated that [facility] have the wrong diagnosis code for the lung testing screeing. The code they have is for an annual exam. Please advise. See notes from referral below. Estelle JuneRhonda Cunningham,CMA Referral Information   Referral # Creation Date Referral Status Status Update  [id number] 04/25/2016 Authorized 04/25/2016: Status History  Status Reason Referral Type Referral Reasons Referral Class  No Approval Necessary - Patient Tracking Consultation none Internal  To Specialty To Provider To Location/POS To Department  Pulmonology Bevelyn NgoSarah F Groce, NP [facility]  To Vendor Referred By By Location/POS By Department  none Sunnie NielsenNatalie Alexander, DO [facility]  Priority Start Date Expiration Date Referral Entered By  none 04/25/2016 01/20/2017 Sunnie NielsenNatalie Alexander, DO  Visits Requested Visits Authorized Visits Completed Visits Scheduled  1 1 0 0  Procedure Information   Procedure Modifiers Provider Requested Approved  [id number] - Ambulatory Referral for Lung Cancer Scre   1 1  Diagnosis Information   Diagnosis  Z00.00 (ICD-10-CM) - Annual physical exam  Referral Notes  Number of Notes: 2  Type Date User Summary Attachment  General 04/27/2016 9:41 AM Rosetta PosnerShelby J Johnson - -  Note   This was placed in Kandice RobinsonsSarah Groce, NP workque for nurse to call and schedule appointment with the patient.  DIAGNOSIS CODE is incorrect as it states for an ANNUAL PHYSICAL EXAM.  Patient is calling your office to ask that this be corrected.        Type Date User Summary Attachment  General 04/25/2016 3:48 PM Dola Factorindy A Feaster - -  Note   [facility] epic [facility] they will call and schedule with patient for good appointment time. - CF        Referral Order   Order  Ambulatory Referral for Lung Cancer Scre (Order # [id number]) on 04/25/2016  View Encounter

## 2016-04-28 NOTE — Telephone Encounter (Signed)
Can put the order under diagnosis ICD 10 code [id number]

## 2016-04-29 LAB — HEMOGLOBIN A1C
Hgb A1c MFr Bld: 5.1 % (ref <5.7)
MEAN PLASMA GLUCOSE: 100 mg/dL

## 2016-05-03 ENCOUNTER — Telehealth: Payer: Self-pay | Admitting: Acute Care

## 2016-05-03 DIAGNOSIS — F17210 Nicotine dependence, cigarettes, uncomplicated: Secondary | ICD-10-CM

## 2016-05-06 NOTE — Telephone Encounter (Signed)
Will forward to the lung screening pool 

## 2016-05-16 LAB — COLOGUARD: Cologuard: POSITIVE

## 2016-05-17 ENCOUNTER — Telehealth: Payer: Self-pay | Admitting: Osteopathic Medicine

## 2016-05-17 DIAGNOSIS — R195 Other fecal abnormalities: Secondary | ICD-10-CM

## 2016-05-17 HISTORY — DX: Other fecal abnormalities: R19.5

## 2016-05-17 NOTE — Telephone Encounter (Signed)
Please call patient: I have received her results from cologuard, testing was positive, plan to refer to GI or colonoscopy

## 2016-05-18 NOTE — Telephone Encounter (Signed)
Left detailed message on patient vm with results and advise as noted below. Rhonda Cunningham,CMA  

## 2016-05-20 NOTE — Telephone Encounter (Signed)
[facility] pt [facility]. [facility] ordered Nothing further needed

## 2016-05-29 ENCOUNTER — Encounter: Payer: Self-pay | Admitting: Osteopathic Medicine

## 2016-05-30 ENCOUNTER — Ambulatory Visit (INDEPENDENT_AMBULATORY_CARE_PROVIDER_SITE_OTHER): Payer: BLUE CROSS/BLUE SHIELD | Admitting: Acute Care

## 2016-05-30 ENCOUNTER — Encounter: Payer: Self-pay | Admitting: Acute Care

## 2016-05-30 ENCOUNTER — Ambulatory Visit (INDEPENDENT_AMBULATORY_CARE_PROVIDER_SITE_OTHER)
Admission: RE | Admit: 2016-05-30 | Discharge: 2016-05-30 | Disposition: A | Discharge status: Home / Self Care | Payer: BLUE CROSS/BLUE SHIELD | Source: Ambulatory Visit | Attending: Acute Care | Admitting: Acute Care

## 2016-05-30 DIAGNOSIS — F17210 Nicotine dependence, cigarettes, uncomplicated: Secondary | ICD-10-CM | POA: Diagnosis not present

## 2016-05-30 DIAGNOSIS — Z87891 Personal history of nicotine dependence: Secondary | ICD-10-CM

## 2016-05-30 NOTE — Progress Notes (Signed)
Shared Decision Making Visit Lung Cancer Screening Program [phone])   Eligibility:  Age 56 y.o.  Pack Years Smoking History Calculation 43-pack-year smoking history (# packs/per year x # years smoked)  Recent History of coughing up blood  no  Unexplained weight loss? no ( >Than 15 pounds within the last 6 months )  Prior History Lung / other cancer no (Diagnosis within the last 5 years already requiring surveillance chest CT Scans).  Smoking Status Current Smoker  Former Smokers: Years since quit: na  Quit Date: na  Visit Components:  Discussion included one or more decision making aids. yes  Discussion included risk/benefits of screening. yes  Discussion included potential follow up diagnostic testing for abnormal scans. yes  Discussion included meaning and risk of over diagnosis. yes  Discussion included meaning and risk of False Positives. yes  Discussion included meaning of total radiation exposure. yes  Counseling Included:  Importance of adherence to annual lung cancer LDCT screening. yes  Impact of comorbidities on ability to participate in the program. yes  Ability and willingness to under diagnostic treatment. yes  Smoking Cessation Counseling:  Current Smokers:   Discussed importance of smoking cessation. yes  Information about tobacco cessation classes and interventions provided to patient. yes  Patient provided with "ticket" for LDCT Scan. yes  Symptomatic Patient. no  Counseling  Diagnosis Code: Tobacco Use Z72.0  Asymptomatic Patient yes  Counseling (Intermediate counseling: > three minutes counseling) U0454  Former Smokers:   Discussed the importance of maintaining cigarette abstinence. yes  Diagnosis Code: Personal History of Nicotine Dependence. U98.119  Information about tobacco cessation classes and interventions provided to patient. Yes  Patient provided with "ticket" for LDCT Scan. yes  Written Order for Lung Cancer  Screening with LDCT placed in Epic. Yes (CT Chest Lung Cancer Screening Low Dose W/O CM) [id number] Z12.2-Screening of respiratory organs Z87.891-Personal history of nicotine dependence  I have spent 25 minutes of face to face time with Mr. Dyas discussing the risks and benefits of lung cancer screening. We viewed a power point together that explained in detail the above noted topics. We paused at intervals to allow for questions to be asked and answered to ensure understanding.We discussed that the single most powerful action that he can take to decrease his risk of developing lung cancer is to quit smoking. We discussed whether or not he is ready to commit to setting a quit date. He is currently not ready to set a quit date. We discussed options for tools to aid in quitting smoking including nicotine replacement therapy, non-nicotine medications, support groups, Quit Smart classes, and behavior modification. We discussed that often times setting smaller, more achievable goals, such as eliminating 1 cigarette a day for a week and then 2 cigarettes a day for a week can be helpful in slowly decreasing the number of cigarettes smoked. This allows for a sense of accomplishment as well as providing a clinical benefit. I gave Mr. Glinski the " Be Stronger Than Your Excuses" card with contact information for community resources, classes, free nicotine replacement therapy, and access to mobile apps, text messaging, and on-line smoking cessation help. I have also given him my card and contact information in the event he needs to contact me. We discussed the time and location of the scan, and that either Jerolyn Shin, CMA, or I will call with the results within 24-48 hours of receiving them. I have provided him with a copy of the power point we viewed  as  a resource in the event they need reinforcement of the concepts we discussed today in the office. The patient verbalized understanding of all of  the above and had no  further questions upon leaving the office. They have my contact information in the event they have any further questions.  We also discussed the fact there has been a high incidence of coronary artery disease noted on these exams. Mr. Antony ContrasGabard has just recently had his cholesterol checked. He is currently not on a statin. I explained that I would forward the results of the scan to his primary care physician at the time the results are available. He verbalized understanding of the above and had no further questions.    Bevelyn NgoSarah F Jaidynn Balster, NP 05/30/2016

## 2016-06-03 ENCOUNTER — Telehealth: Payer: Self-pay | Admitting: Acute Care

## 2016-06-03 DIAGNOSIS — F17210 Nicotine dependence, cigarettes, uncomplicated: Secondary | ICD-10-CM

## 2016-06-03 NOTE — Telephone Encounter (Signed)
Pt was called by Kandice RobinsonsSarah Groce, NP and informed of low dose chest ct results.  Copy sent to Dr Sunnie NielsenNatalie Alexander.  1 yr f/u ct ordered.  Nothing further needed.

## 2016-06-06 ENCOUNTER — Encounter: Payer: Self-pay | Admitting: Osteopathic Medicine

## 2016-06-06 DIAGNOSIS — R911 Solitary pulmonary nodule: Secondary | ICD-10-CM | POA: Insufficient documentation

## 2016-06-06 HISTORY — DX: Solitary pulmonary nodule: R91.1

## 2016-06-23 ENCOUNTER — Encounter: Payer: Self-pay | Admitting: Osteopathic Medicine

## 2016-06-29 LAB — HM COLONOSCOPY

## 2017-02-03 ENCOUNTER — Ambulatory Visit (INDEPENDENT_AMBULATORY_CARE_PROVIDER_SITE_OTHER): Payer: BLUE CROSS/BLUE SHIELD | Admitting: Osteopathic Medicine

## 2017-02-03 DIAGNOSIS — Z23 Encounter for immunization: Secondary | ICD-10-CM

## 2017-03-20 ENCOUNTER — Encounter: Payer: Self-pay | Admitting: Osteopathic Medicine

## 2017-04-29 IMAGING — CT CT CHEST LUNG CANCER SCREENING LOW DOSE W/O CM
2 of 4 series · 15 of 40 positions shown, 18 images · non-contrast
Comparison: None.

CLINICAL DATA: 55-year-old male current smoker with 43 pack-year
history of smoking, for initial lung cancer screening

EXAM:
CT CHEST WITHOUT CONTRAST LOW-DOSE FOR LUNG CANCER SCREENING
TECHNIQUE: Multidetector CT imaging of the chest was performed following the
standard protocol without IV contrast.

[Series 2: thorax 5.0 i31f 3 · axial · 0.79mm/px · z∈[-310,-25]mm · 12 of 69 slices shown, 15 images]
[im 6/69  mediastinal]
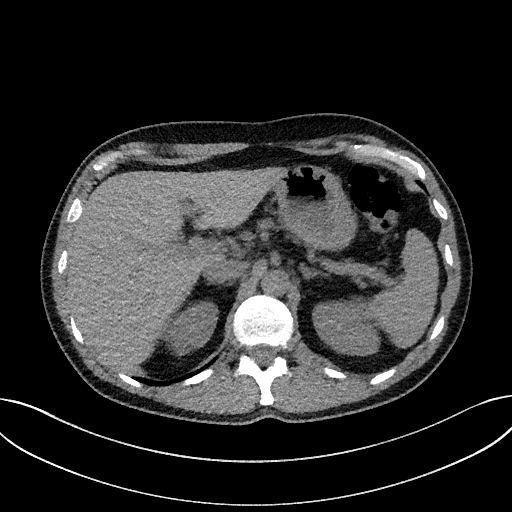
[im 6/69  lung]
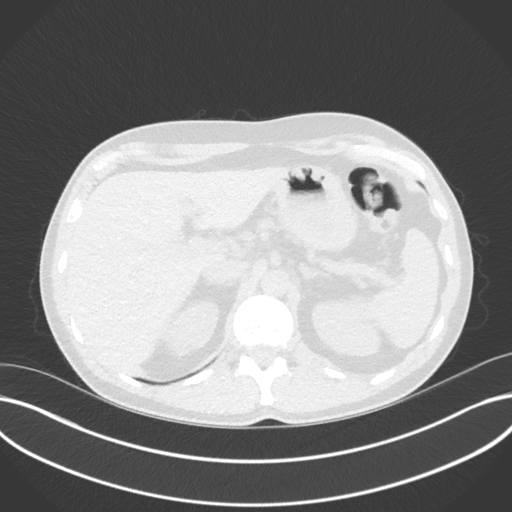
[im 11/69  lung]
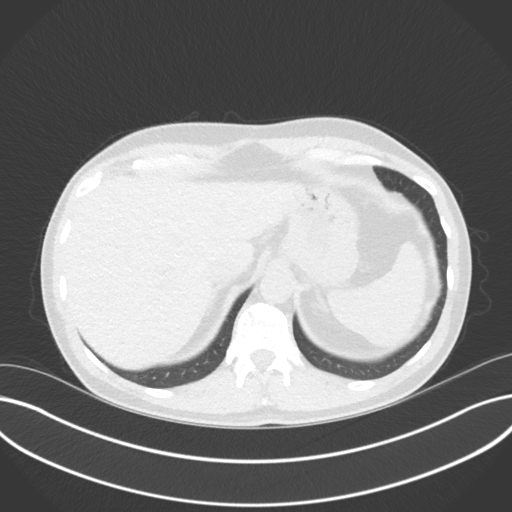
[im 16/69  lung]
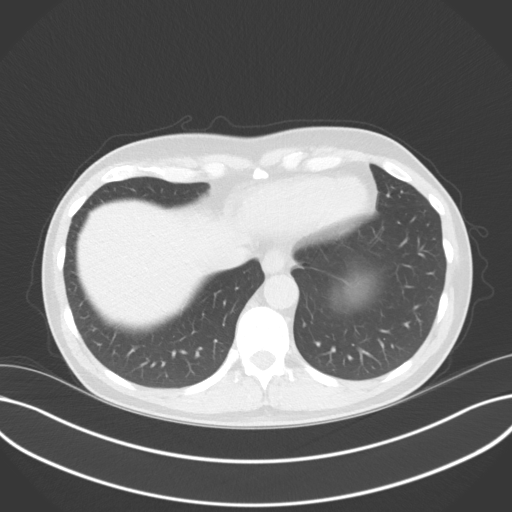
[im 21/69  lung]
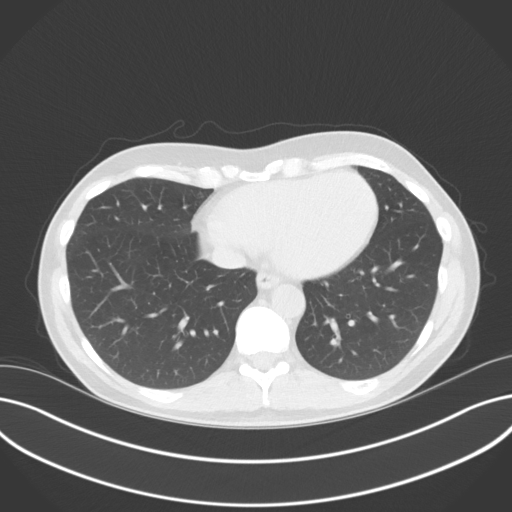
[im 27/69  mediastinal]
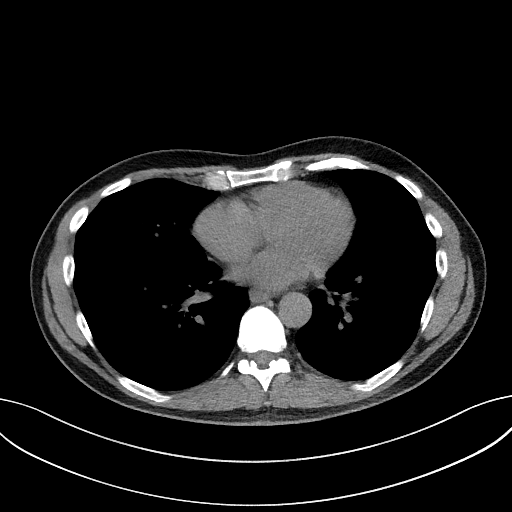
[im 27/69  lung]
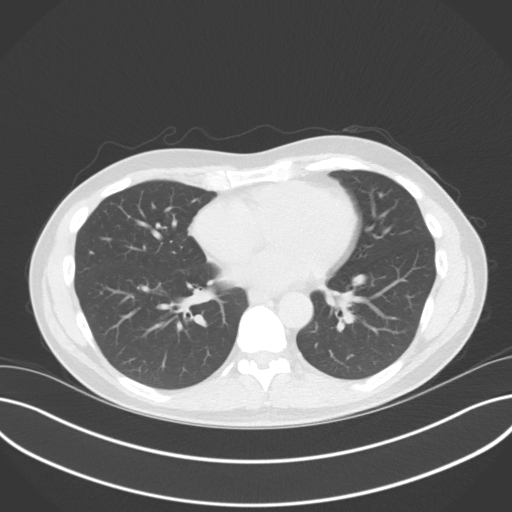
[im 32/69  lung]
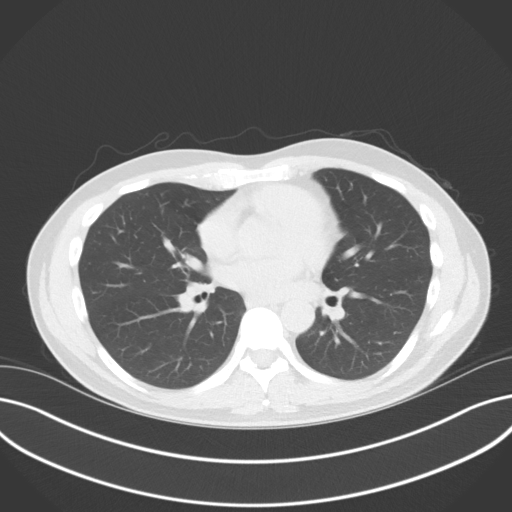
[im 37/69  lung]
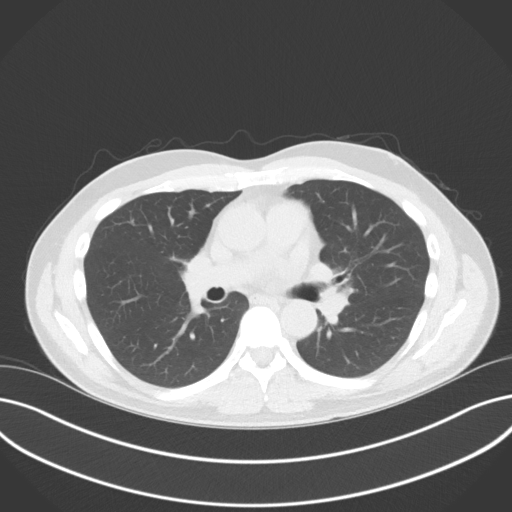
[im 42/69  lung]
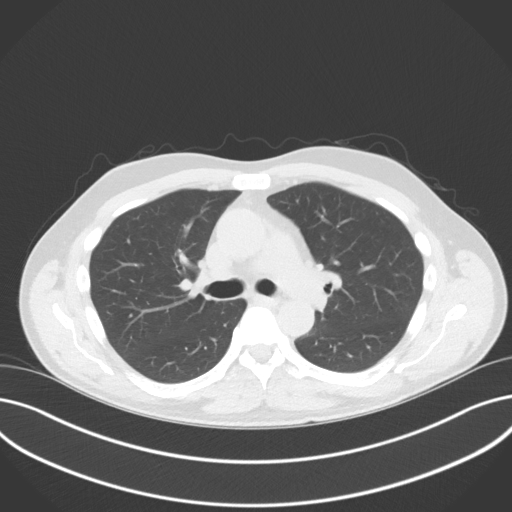
[im 48/69  mediastinal]
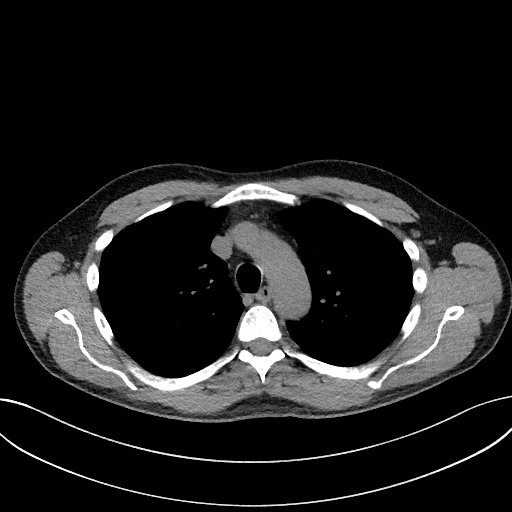
[im 48/69  lung]
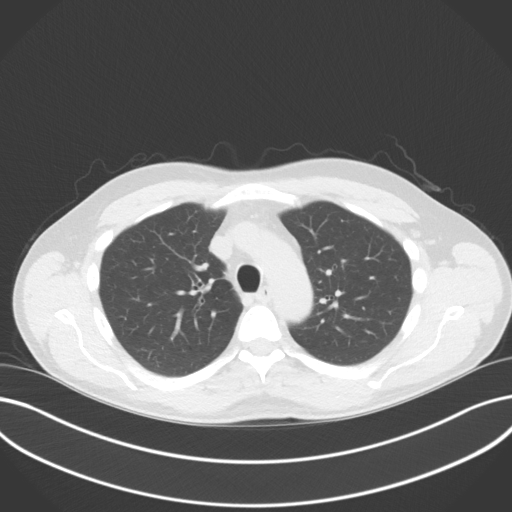
[im 53/69  lung]
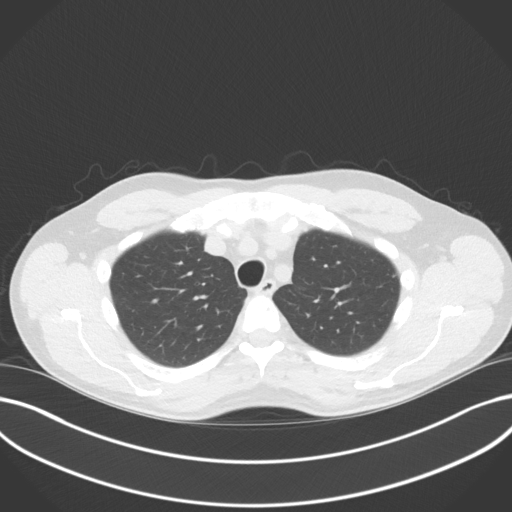
[im 58/69  lung]
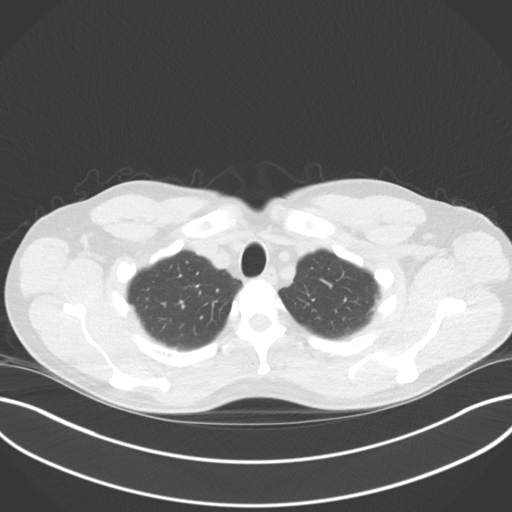
[im 63/69  lung]
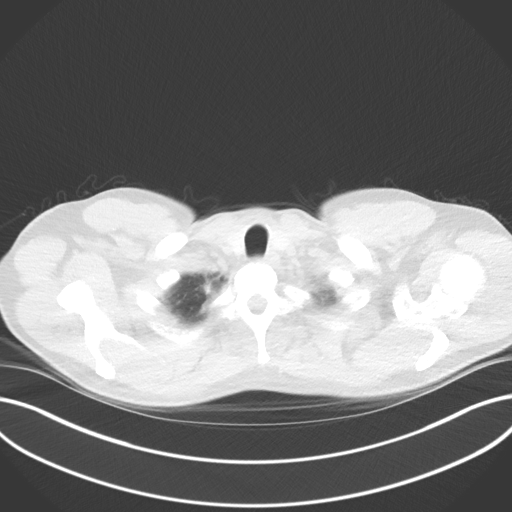

[Series 5: coronal · coronal · 0.67mm/px · 3 of 108 slices shown]
[im 22/108  lung]
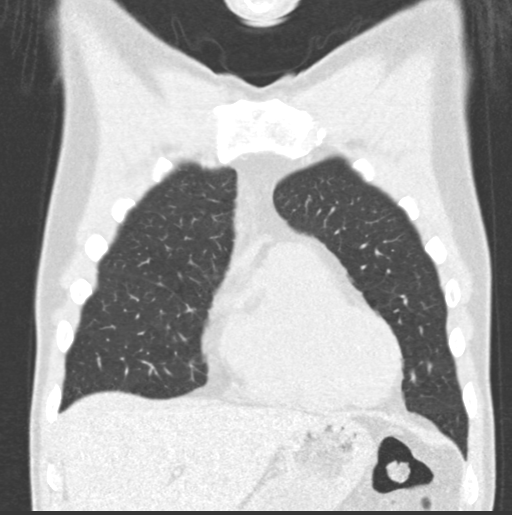
[im 43/108  lung]
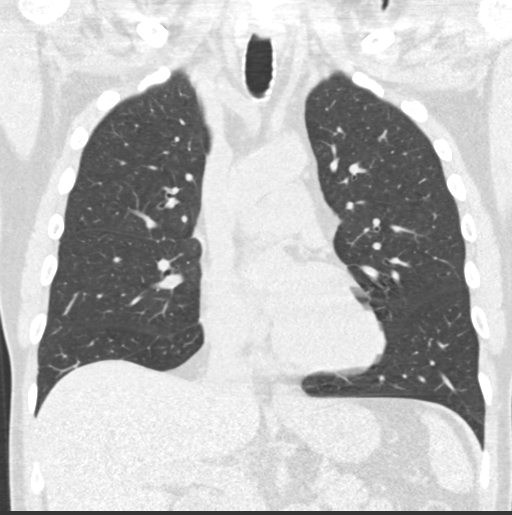
[im 65/108  lung]
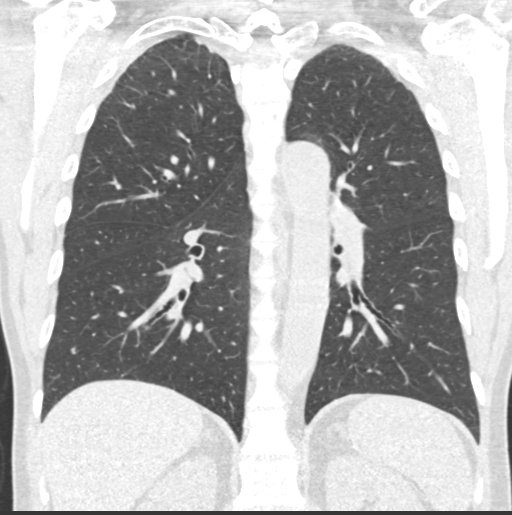

[15 of 40 positions shown; findings below may reference images not displayed]

FINDINGS: Cardiovascular: Heart is normal size.  No pericardial effusion.

No evidence of thoracic aortic aneurysm.

Mediastinum/Nodes: No suspicious mediastinal lymphadenopathy.

Visualized thyroid is unremarkable.

Lungs/Pleura: Mild biapical pleural-parenchymal scarring.

Mild paraseptal emphysematous changes, upper lobe predominant.

2.2 mm subpleural nodule in the lingula (series 3/ image 216).

No focal consolidation.

No pleural effusion or pneumothorax.

Upper Abdomen: Visualized upper abdomen is unremarkable.

Musculoskeletal: Visualized osseous structures are within normal
limits.
IMPRESSION: 2.2 mm subpleural nodule in the lingula. Lung-RADS Category 2,
benign appearance or behavior. Continue annual screening with
low-dose chest CT without contrast in 12 months.

## 2017-06-02 ENCOUNTER — Ambulatory Visit: Payer: BLUE CROSS/BLUE SHIELD

## 2017-06-07 ENCOUNTER — Ambulatory Visit: Payer: BLUE CROSS/BLUE SHIELD

## 2017-06-07 DIAGNOSIS — F17210 Nicotine dependence, cigarettes, uncomplicated: Secondary | ICD-10-CM

## 2017-06-09 ENCOUNTER — Encounter: Payer: Self-pay | Admitting: Family Medicine

## 2017-06-09 ENCOUNTER — Ambulatory Visit: Payer: BLUE CROSS/BLUE SHIELD | Admitting: Family Medicine

## 2017-06-09 ENCOUNTER — Telehealth: Payer: Self-pay | Admitting: Family Medicine

## 2017-06-09 VITALS — BP 126/89 | HR 90 | Temp 98.5°F | Ht 72.01 in | Wt 165.0 lb

## 2017-06-09 DIAGNOSIS — I700 Atherosclerosis of aorta: Secondary | ICD-10-CM | POA: Diagnosis not present

## 2017-06-09 DIAGNOSIS — J439 Emphysema, unspecified: Secondary | ICD-10-CM | POA: Diagnosis not present

## 2017-06-09 DIAGNOSIS — R062 Wheezing: Secondary | ICD-10-CM | POA: Diagnosis not present

## 2017-06-09 DIAGNOSIS — J101 Influenza due to other identified influenza virus with other respiratory manifestations: Secondary | ICD-10-CM

## 2017-06-09 DIAGNOSIS — R6889 Other general symptoms and signs: Secondary | ICD-10-CM

## 2017-06-09 HISTORY — DX: Emphysema, unspecified: J43.9

## 2017-06-09 LAB — POCT INFLUENZA A/B
Influenza A, POC: POSITIVE — AB
Influenza B, POC: NEGATIVE

## 2017-06-09 MED ORDER — GUAIFENESIN-CODEINE 100-10 MG/5ML PO SOLN: 5.0000 mL | Freq: Every evening | ORAL | 0 refills | Status: DC | PRN

## 2017-06-09 MED ORDER — IPRATROPIUM-ALBUTEROL 0.5-2.5 (3) MG/3ML IN SOLN: 3.0000 mL | Freq: Once | RESPIRATORY_TRACT | Status: DC

## 2017-06-09 MED ORDER — BENZONATATE 200 MG PO CAPS: 200.0000 mg | ORAL_CAPSULE | Freq: Three times a day (TID) | ORAL | 0 refills | Status: DC | PRN

## 2017-06-09 MED ORDER — OSELTAMIVIR PHOSPHATE 75 MG PO CAPS: 75.0000 mg | ORAL_CAPSULE | Freq: Two times a day (BID) | ORAL | 0 refills | Status: DC

## 2017-06-09 NOTE — Patient Instructions (Addendum)
Thank you for coming in today. Continue tylenol or ibuprofen for pain and fever and headaches.  Use cough medicine as needed.  Take tamiflu.  Recheck as needed.    Influenza, Adult Influenza, more commonly known as "the flu," is a viral infection that primarily affects the respiratory tract. The respiratory tract includes organs that help you breathe, such as the lungs, nose, and throat. The flu causes many common cold symptoms, as well as a high fever and body aches. The flu spreads easily from person to person (is contagious). Getting a flu shot (influenza vaccination) every year is the best way to prevent influenza. What are the causes? Influenza is caused by a virus. You can catch the virus by:  Breathing in droplets from an infected person's cough or sneeze.  Touching something that was recently contaminated with the virus and then touching your mouth, nose, or eyes.  What increases the risk? The following factors may make you more likely to get the flu:  Not cleaning your hands frequently with soap and water or alcohol-based hand sanitizer.  Having close contact with many people during cold and flu season.  Touching your mouth, eyes, or nose without washing or sanitizing your hands first.  Not drinking enough fluids or not eating a healthy diet.  Not getting enough sleep or exercise.  Being under a high amount of stress.  Not getting a yearly (annual) flu shot.  You may be at a higher risk of complications from the flu, such as a severe lung infection (pneumonia), if you:  Are over the age of 57.  Are pregnant.  Have a weakened disease-fighting system (immune system). You may have a weakened immune system if you: ? Have HIV or AIDS. ? Are undergoing chemotherapy. ? Aretaking medicines that reduce the activity of (suppress) the immune system.  Have a long-term (chronic) illness, such as heart disease, kidney disease, diabetes, or lung disease.  Have a liver  disorder.  Are obese.  Have anemia.  What are the signs or symptoms? Symptoms of this condition typically last 4-10 days and may include:  Fever.  Chills.  Headache, body aches, or muscle aches.  Sore throat.  Cough.  Runny or congested nose.  Chest discomfort and cough.  Poor appetite.  Weakness or tiredness (fatigue).  Dizziness.  Nausea or vomiting.  How is this diagnosed? This condition may be diagnosed based on your medical history and a physical exam. Your health care provider may do a nose or throat swab test to confirm the diagnosis. How is this treated? If influenza is detected early, you can be treated with antiviral medicine that can reduce the length of your illness and the severity of your symptoms. This medicine may be given by mouth (orally) or through an IV tube that is inserted in one of your veins. The goal of treatment is to relieve symptoms by taking care of yourself at home. This may include taking over-the-counter medicines, drinking plenty of fluids, and adding humidity to the air in your home. In some cases, influenza goes away on its own. Severe influenza or complications from influenza may be treated in a hospital. Follow these instructions at home:  Take over-the-counter and prescription medicines only as told by your health care provider.  Use a cool mist humidifier to add humidity to the air in your home. This can make breathing easier.  Rest as needed.  Drink enough fluid to keep your urine clear or pale yellow.  Cover your mouth  and nose when you cough or sneeze.  Wash your hands with soap and water often, especially after you cough or sneeze. If soap and water are not available, use hand sanitizer.  Stay home from work or school as told by your health care provider. Unless you are visiting your health care provider, try to avoid leaving home until your fever has been gone for 24 hours without the use of medicine.  Keep all follow-up  visits as told by your health care provider. This is important. How is this prevented?  Getting an annual flu shot is the best way to avoid getting the flu. You may get the flu shot in late summer, fall, or winter. Ask your health care provider when you should get your flu shot.  Wash your hands often or use hand sanitizer often.  Avoid contact with people who are sick during cold and flu season.  Eat a healthy diet, drink plenty of fluids, get enough sleep, and exercise regularly. Contact a health care provider if:  You develop new symptoms.  You have: ? Chest pain. ? Diarrhea. ? A fever.  Your cough gets worse.  You produce more mucus.  You feel nauseous or you vomit. Get help right away if:  You develop shortness of breath or difficulty breathing.  Your skin or nails turn a bluish color.  You have severe pain or stiffness in your neck.  You develop a sudden headache or sudden pain in your face or ear.  You cannot stop vomiting. This information is not intended to replace advice given to you by your health care provider. Make sure you discuss any questions you have with your health care provider. Document Released: 03/18/2000 Document Revised: 08/27/2015 Document Reviewed: 01/13/2015 Elsevier Interactive Patient Education  2017 [employer].

## 2017-06-09 NOTE — Progress Notes (Signed)
Jeffery Callahan is a 57 y.o. male who presents to [facility] today for cough congestion fevers chills body aches headaches.  Symptoms present for about 3 days now.  Patient is tried some over-the-counter medications which help a bit.  He notes the cough is mildly productive.  He notes some mild wheezing but no significant shortness of breath.  No chest pain or palpitations.  He notes positive sick contacts.  His mother is ill and has been hospitalized recently.  He has been in waiting rooms with sick people recently.  He received a flu vaccine already this year.  Note [place] had his low-dose chest CT scan for lung cancer screening on March 7 which did not show pneumonia but did show emphysema like changes.   Past Medical History:  Diagnosis Date  . Emphysema lung (HCC) 06/09/2017   Noted on CT scan March 2019  . Lung nodule seen on imaging study 06/06/2016   05/30/2016 "IMPRESSION: 2.2 mm subpleural nodule in the lingula. Lung-RADS Category 2, benign appearance or behavior. Continue annual screening with low-dose chest CT without contrast in 12 months."  . Positive FIT (fecal immunochemical test) 05/17/2016   Referral to GI placed 05/17/16    Past Surgical History:  Procedure Laterality Date  . HAND SURGERY Left   . SHOULDER SURGERY Right   . TONSILLECTOMY     Social History   Tobacco Use  . Smoking status: Current Every Day Smoker    Packs/day: 1.00    Years: 43.00    Pack years: 43.00  . Smokeless tobacco: Never Used  Substance Use Topics  . Alcohol use: Yes   family history includes Cancer in his maternal aunt and maternal grandmother; Depression in his mother; Stroke in his paternal uncle.  ROS as above:  Medications: Current Outpatient Medications  Medication Sig Dispense Refill  . ASHWAGANDHA PO Take by mouth.    Marland Kitchen aspirin 81 MG tablet Take 81 mg by mouth  daily.    . benzonatate (TESSALON) 200 MG capsule Take 1 capsule (200 mg total) by mouth 3 (three) times daily as needed for cough. 45 capsule 0  . COCONUT OIL PO Take by mouth.    Marland Kitchen guaiFENesin-codeine 100-10 MG/5ML syrup Take 5 mLs by mouth at bedtime as needed for cough. 120 mL 0  . KRILL OIL PO Take by mouth.    . Multiple Vitamin (MULTIVITAMIN) capsule Take 1 capsule by mouth daily.    Marland Kitchen oseltamivir (TAMIFLU) 75 MG capsule Take 1 capsule (75 mg total) by mouth 2 (two) times daily. 10 capsule 0   Current Facility-Administered Medications  Medication Dose Route Frequency Provider Last Rate Last Dose  . ipratropium-albuterol (DUONEB) 0.5-2.5 (3) MG/3ML nebulizer solution 3 mL  3 mL Nebulization Once Rodolph Bong, MD       No Known Allergies  Health Maintenance Health Maintenance  Topic Date Due  . TETANUS/TDAP  09/22/2025  . COLONOSCOPY  06/30/2026  . INFLUENZA VACCINE  Completed  . Hepatitis C Screening  Completed  . HIV Screening  Completed     Exam:  BP 126/89   Pulse 90   Temp 98.5 F (36.9 C)   Ht 6' 0.01" (1.829 m)   Wt 165 lb (74.8 kg)   BMI 22.37 kg/m  Gen: Well NAD HEENT: EOMI,  MMM posterior pharynx with cobblestoning.  Normal tympanic membranes.  Mild cervical lymphadenopathy. Lungs: Normal work of breathing.  Wheezing and  rhonchi present bilaterally. Heart: RRR no MRG Abd: NABS, Soft. Nondistended, Nontender Exts: Brisk capillary refill, warm and well perfused.   Patient was given a 2.5/0.5 mg DuoNeb nebulizer treatment and did not feel much better.  His lung exam normalized.     Results for orders placed or performed in visit on 06/09/17 (from the past 72 hour(s))  POCT Influenza A/B     Status: Abnormal   Collection Time: 06/09/17 10:52 AM  Result Value Ref Range   Influenza A, POC Positive (A) Negative   Influenza B, POC Negative Negative   Ct Chest Lung Ca Screen Low Dose W/o Cm  Result Date: 06/08/2017 CLINICAL DATA:  Current smoker, 44  pack-year history, lung cancer screening. EXAM: CT CHEST WITHOUT CONTRAST LOW-DOSE FOR LUNG CANCER SCREENING TECHNIQUE: Multidetector CT imaging of the chest was performed following the standard protocol without IV contrast. COMPARISON:  05/30/2016. FINDINGS: Cardiovascular: Atherosclerotic calcification of the arterial vasculature. Heart size normal. No pericardial effusion. Mediastinum/Nodes: No pathologically enlarged mediastinal or axillary lymph nodes. Hilar regions are difficult to definitively evaluate without IV contrast but appear grossly unremarkable. Esophagus is grossly unremarkable. Lungs/Pleura: Biapical pleuroparenchymal scarring. Mild centrilobular and paraseptal emphysema. Pulmonary nodules measure 3.8 mm or less in size and are stable. No new pulmonary nodules. No pleural fluid. Probable adherent debris along the ventral trachea. Debris is also seen in the bronchus intermedius. Upper Abdomen: Visualized portions of the liver, adrenal glands, kidneys, spleen, pancreas, stomach and bowel are grossly unremarkable. Musculoskeletal: No worrisome lytic or sclerotic lesions. IMPRESSION: 1. Lung-RADS 2, benign appearance or behavior. Continue annual screening with low-dose chest CT without contrast in 12 months. 2.  Aortic atherosclerosis (ICD10-170.0). 3.  Emphysema (ICD10-J43.9). Electronically Signed   By: Leanna BattlesMelinda  Blietz M.D.   On: 06/08/2017 09:49   I personally (independently) visualized and performed the interpretation of the images attached in this note.    Assessment and Plan: 57 y.o. male with  Influenza based on symptoms and positive point-of-care flu test today.  Plan to start treatment with Tamiflu as patient is barely inside the treatment window.  Additionally continue over-the-counter medications for symptomatic control.  Will use additionally codeine cough syrup and Tessalon for cough control.  Emphysema on CT scan: Recommend smoking cessation.  Patient will follow-up with  PCP. Did not perceive much benefit from nebulizer treatment.  Therefore albuterol inhaler was not prescribed.  Aortic atherosclerosis seen on CT scan: Recommend patient follow-up with PCP to address lipids and smoking cessation.  Patient is currently taking aspirin.   Orders Placed This Encounter  Procedures  . POCT Influenza A/B   Meds ordered this encounter  Medications  . oseltamivir (TAMIFLU) 75 MG capsule    Sig: Take 1 capsule (75 mg total) by mouth 2 (two) times daily.    Dispense:  10 capsule    Refill:  0  . benzonatate (TESSALON) 200 MG capsule    Sig: Take 1 capsule (200 mg total) by mouth 3 (three) times daily as needed for cough.    Dispense:  45 capsule    Refill:  0  . guaiFENesin-codeine 100-10 MG/5ML syrup    Sig: Take 5 mLs by mouth at bedtime as needed for cough.    Dispense:  120 mL    Refill:  0  . ipratropium-albuterol (DUONEB) 0.5-2.5 (3) MG/3ML nebulizer solution 3 mL     Discussed warning signs or symptoms. Please see discharge instructions. Patient expresses understanding.

## 2017-06-12 ENCOUNTER — Other Ambulatory Visit: Payer: Self-pay | Admitting: Acute Care

## 2017-06-12 DIAGNOSIS — Z122 Encounter for screening for malignant neoplasm of respiratory organs: Secondary | ICD-10-CM

## 2017-06-12 DIAGNOSIS — F17210 Nicotine dependence, cigarettes, uncomplicated: Secondary | ICD-10-CM

## 2017-06-13 ENCOUNTER — Ambulatory Visit (INDEPENDENT_AMBULATORY_CARE_PROVIDER_SITE_OTHER): Payer: BLUE CROSS/BLUE SHIELD | Admitting: Physician Assistant

## 2017-06-13 ENCOUNTER — Encounter: Payer: Self-pay | Admitting: Physician Assistant

## 2017-06-13 VITALS — BP 155/88 | HR 63 | Temp 98.3°F | Wt 167.0 lb

## 2017-06-13 DIAGNOSIS — J101 Influenza due to other identified influenza virus with other respiratory manifestations: Secondary | ICD-10-CM

## 2017-06-13 DIAGNOSIS — R0989 Other specified symptoms and signs involving the circulatory and respiratory systems: Secondary | ICD-10-CM | POA: Diagnosis not present

## 2017-06-13 DIAGNOSIS — R030 Elevated blood-pressure reading, without diagnosis of hypertension: Secondary | ICD-10-CM

## 2017-06-13 DIAGNOSIS — J22 Unspecified acute lower respiratory infection: Secondary | ICD-10-CM | POA: Diagnosis not present

## 2017-06-13 DIAGNOSIS — J439 Emphysema, unspecified: Secondary | ICD-10-CM

## 2017-06-13 MED ORDER — DOXYCYCLINE HYCLATE 100 MG PO TABS: 100.0000 mg | ORAL_TABLET | Freq: Two times a day (BID) | ORAL | 0 refills | Status: AC

## 2017-06-13 MED ORDER — PREDNISONE 50 MG PO TABS: 50.0000 mg | ORAL_TABLET | Freq: Every day | ORAL | 0 refills | Status: DC

## 2017-06-13 NOTE — Progress Notes (Signed)
HPI:                                                                Jeffery Callahan is a 57 y.o. male who presents to [facility] today for cough  Patient with PMH of emphysema, pulmonary nodules, tobacco use (44 pack years) presents today with productive cough x 1 week. He was diagnosed with influenza A on 06/09/17 (5 days ago). He was treated with Tamiflu, Tessalon, and Mucinex with Codeine. He did not improve with Duoneb, so Albuterol was not prescribed. Reports he is improving, but still having low-grade fevers (99) and dyspnea. Treating fever with Ibuprofen 800 mg. Denies chills, myalgias, hemoptysis.  CT chest on 06/07/17 showed adherent debris along the trachea and bronchus intermedius; Stable, benign appearing nodules; Biapical parenchymal scarring and emphysema.  No flowsheet data found.    Past Medical History:  Diagnosis Date  . Emphysema lung (HCC) 06/09/2017   Noted on CT scan March 2019  . Lung nodule seen on imaging study 06/06/2016   05/30/2016 "IMPRESSION: 2.2 mm subpleural nodule in the lingula. Lung-RADS Category 2, benign appearance or behavior. Continue annual screening with low-dose chest CT without contrast in 12 months."  . Positive FIT (fecal immunochemical test) 05/17/2016   Referral to GI placed 05/17/16    Past Surgical History:  Procedure Laterality Date  . HAND SURGERY Left   . SHOULDER SURGERY Right   . TONSILLECTOMY     Social History   Tobacco Use  . Smoking status: Current Every Day Smoker    Packs/day: 1.00    Years: 43.00    Pack years: 43.00  . Smokeless tobacco: Never Used  Substance Use Topics  . Alcohol use: Yes   family history includes Cancer in his maternal aunt and maternal grandmother; Depression in his mother; Stroke in his paternal uncle.    ROS: negative except as noted in the HPI  Medications: Current Outpatient Medications  Medication Sig Dispense Refill  . ASHWAGANDHA PO Take by  mouth.    Marland Kitchen aspirin 81 MG tablet Take 81 mg by mouth daily.    . benzonatate (TESSALON) 200 MG capsule Take 1 capsule (200 mg total) by mouth 3 (three) times daily as needed for cough. 45 capsule 0  . COCONUT OIL PO Take by mouth.    Marland Kitchen guaiFENesin-codeine 100-10 MG/5ML syrup Take 5 mLs by mouth at bedtime as needed for cough. 120 mL 0  . KRILL OIL PO Take by mouth.    . Multiple Vitamin (MULTIVITAMIN) capsule Take 1 capsule by mouth daily.    Marland Kitchen oseltamivir (TAMIFLU) 75 MG capsule Take 1 capsule (75 mg total) by mouth 2 (two) times daily. 10 capsule 0  . doxycycline (VIBRA-TABS) 100 MG tablet Take 1 tablet (100 mg total) by mouth 2 (two) times daily for 7 days. 14 tablet 0  . predniSONE (DELTASONE) 50 MG tablet Take 1 tablet (50 mg total) by mouth daily. 5 tablet 0   No current facility-administered medications for this visit.    No Known Allergies     Objective:  BP (!) 155/88   Pulse 63   Temp 98.3 F (36.8 C) (Oral)   Wt 167 lb (75.8 kg)   SpO2 99%  BMI 22.64 kg/m  Gen:  alert, not ill-appearing, no distress, appropriate for age HEENT: head normocephalic without obvious abnormality, conjunctiva and cornea clear, oropharynx clear, nasal mucosa pink, neck supple, no cervical adenopathy, trachea midline Pulm: Normal work of breathing, normal phonation, inspiratory wheezes and expiratory rhonchi diffusely  CV: Normal rate, regular rhythm, s1 and s2 distinct, no murmurs, clicks or rubs  Neuro: alert and oriented x 3, no tremor MSK: extremities atraumatic, normal gait and station Skin: intact, no rashes on exposed skin, no jaundice, no cyanosis   No results found for this or any previous visit (from the past 72 hour(s)). No results found.    Assessment and Plan: 57 y.o. male with   1. Influenza A - complete Tamiflu  2. Rhonchi at both lung bases - continue Mucinex - predniSONE (DELTASONE) 50 MG tablet; Take 1 tablet (50 mg total) by mouth daily.  Dispense: 5 tablet;  Refill: 0  3. Elevated blood pressure reading BP Readings from Last 3 Encounters:  06/13/17 (!) 155/88  06/09/17 126/89  04/25/16 135/80  - recheck BP improved but still elevated. Reports he is under a lot of stress - counseled on therapeutic lifestyle changes - close follow-up with PCP In 2 weeks   4. Acute lower respiratory infection - SpO2 99% on RA at rest, adventitious lung sounds on exam. I am going to treat as COPD exacerbation with steroid burst and antibiotic given recent abnormal chest CT findings. Briefly counseled on smoking cessation and that smoking will prolong the typical duration of an illness - predniSONE (DELTASONE) 50 MG tablet; Take 1 tablet (50 mg total) by mouth daily.  Dispense: 5 tablet; Refill: 0 - doxycycline (VIBRA-TABS) 100 MG tablet; Take 1 tablet (100 mg total) by mouth 2 (two) times daily for 7 days.  Dispense: 14 tablet; Refill: 0   Patient education and anticipatory guidance given Patient agrees with treatment plan Follow-up in 2 weeks with PCP for HTN and cough or sooner as needed if symptoms worsen or fail to improve  Levonne Hubertharley E. Theus Espin PA-C

## 2017-06-13 NOTE — Patient Instructions (Addendum)
For cough: - Steroid and antibiotic for 5 days - Continue Mucinex to make cough more productive. Take with plenty of water - Reduce cigarettes and try using lozenges or patch. Cough will be difficult to control and it will take longer to get over your illness   For your blood pressure: - Goal <130/80 - baby aspirin 81 mg daily to help prevent heart attack/stroke - monitor and log blood pressures at home - check around the same time each day in a relaxed setting - Limit salt to <2000 mg/day - Follow DASH eating plan - limit alcohol to 2 standard drinks per day for men and 1 per day for women - smoking cessation

## 2017-06-21 NOTE — Telephone Encounter (Signed)
Note opened in error.

## 2017-06-28 ENCOUNTER — Ambulatory Visit: Payer: BLUE CROSS/BLUE SHIELD | Admitting: Osteopathic Medicine

## 2017-06-28 ENCOUNTER — Encounter: Payer: Self-pay | Admitting: Osteopathic Medicine

## 2017-06-28 VITALS — BP 168/82 | HR 66 | Temp 97.8°F | Wt 166.0 lb

## 2017-06-28 DIAGNOSIS — R030 Elevated blood-pressure reading, without diagnosis of hypertension: Secondary | ICD-10-CM | POA: Diagnosis not present

## 2017-06-28 DIAGNOSIS — Z0000 Encounter for general adult medical examination without abnormal findings: Secondary | ICD-10-CM

## 2017-06-28 NOTE — Progress Notes (Signed)
HPI: Jeffery Callahan is a 57 y.o. male who  has a past medical history of Emphysema lung (HCC) (06/09/2017), Lung nodule seen on imaging study (06/06/2016), and Positive FIT (fecal immunochemical test) (05/17/2016).  [facility] today, 06/28/17,  for chief complaint of:  Recheck BP  Last seen by me for annual 04/25/2016 and doing pretty well at that point. BP 135/80 at that time.   In the interim seen twice by a colleague for flu illness, BP on the second visit 06/13/2017 was 155/88 and he was advised to come see me to recheck BP and cough.   Reports BP in 120's at home. Increased stress at home. Wife is nurse, she is checking on manual cuff. No CP/SOB. No HA/VC, no dizziness.    (+)stressed lately, mom is unwell, he is in charge of her estate, lots going on there.    [facility] 06/28/2017 04/26/2016  Decreased Interest 3 0  Down, Depressed, Hopeless 3 0  PHQ - 2 Score 6 0  Altered sleeping 3 -  Tired, decreased energy 3 -  Change in appetite 3 -  Feeling bad or failure about yourself  3 -  Trouble concentrating 3 -  Moving slowly or fidgety/restless 3 -  Suicidal thoughts 0 -  PHQ-9 Score 24 -  Difficult doing work/chores Not difficult at all -   GAD 7 : Generalized Anxiety Score 06/28/2017  Nervous, Anxious, on Edge 3  Control/stop worrying 3  Worry too much - different things 3  Trouble relaxing 3  Restless 3  Easily annoyed or irritable 3  Afraid - awful might happen 0  Total GAD 7 Score 18  Anxiety Difficulty Not difficult at all       Past medical history, surgical history, social history and family history reviewed. No updates needed.   Current medication list and allergy/intolerance information reviewed.    Current Outpatient Medications on File Prior to Visit  Medication Sig Dispense Refill  . ASHWAGANDHA PO Take by mouth.    Marland Kitchen. aspirin 81 MG tablet Take 81 mg by mouth daily.    . COCONUT OIL PO Take by  mouth.    Marland Kitchen. KRILL OIL PO Take by mouth.    . Multiple Vitamin (MULTIVITAMIN) capsule Take 1 capsule by mouth daily.    . benzonatate (TESSALON) 200 MG capsule Take 1 capsule (200 mg total) by mouth 3 (three) times daily as needed for cough. (Patient not taking: Reported on 06/28/2017) 45 capsule 0  . guaiFENesin-codeine 100-10 MG/5ML syrup Take 5 mLs by mouth at bedtime as needed for cough. (Patient not taking: Reported on 06/28/2017) 120 mL 0  . oseltamivir (TAMIFLU) 75 MG capsule Take 1 capsule (75 mg total) by mouth 2 (two) times daily. (Patient not taking: Reported on 06/28/2017) 10 capsule 0  . predniSONE (DELTASONE) 50 MG tablet Take 1 tablet (50 mg total) by mouth daily. (Patient not taking: Reported on 06/28/2017) 5 tablet 0   No current facility-administered medications on file prior to visit.    No Known Allergies    Review of Systems:  Constitutional: No recent illness  HEENT: No  headache, no vision change  Cardiac: No  chest pain, No  pressure, No palpitations  Respiratory:  No  shortness of breath. No  Cough  Skin: No  Rash  Neurologic: No  weakness, No  Dizziness  Psychiatric: No  concerns with depression, +concerns with anxiety  Exam:  BP (!) 168/82 (  BP Location: Left Arm, Patient Position: Sitting, Cuff Size: Normal)   Pulse 66   Temp 97.8 F (36.6 C) (Oral)   Wt 166 lb (75.3 kg)   BMI 22.51 kg/m   Constitutional: VS see above. General Appearance: alert, well-developed, well-nourished, NAD  Eyes: Normal lids and conjunctive, non-icteric sclera  Ears, Nose, Mouth, Throat: MMM, Normal external inspection ears/nares/mouth/lips/gums.  Neck: No masses, trachea midline.   Respiratory: Normal respiratory effort. no wheeze, no rhonchi, no rales  Cardiovascular: S1/S2 normal, no murmur, no rub/gallop auscultated. RRR.   Musculoskeletal: Gait normal. Symmetric and independent movement of all extremities  Neurological: Normal balance/coordination. No  tremor.  Skin: warm, dry, intact.   Psychiatric: Normal judgment/insight. Normal mood and affect. Oriented x3.     ASSESSMENT/PLAN:   Elevated blood pressure reading - ideally would like to verify home bloopressure cuff.He is advised to bring this into the office - Plan: CBC, COMPLETE METABOLIC PANEL WITH GFR, Lipid panel, TSH  Annual physical exam - labs ordered for future visit, annual physical was not performed today - Plan: CBC, COMPLETE METABOLIC PANEL WITH GFR, Lipid panel, TSH     Patient Instructions   Plan to return for nurse visit to verify home blood pressure cuff if it's more than one year old. In the meantime, be keeping a record of you rblood pressures at home and bring this with you to the visit with the nurse.   If your cuff is measuring within 5-10 points of ours AND your home numbers are less than 140/90 (ideally less than 130/80) then nothing else to do.   If your home blood pressure cuff is inaccurate or is accurate but measuring above goal, we will need to talk about medications.        Follow-up plan: Return in about 6 months (around 12/29/2017) for recheck BP / annual physical, sooner if needed .  Visit summary with medication list and pertinent instructions was printed for patient to review, alert [place] if any changes needed. All questions at time of visit were answered - patient instructed to contact office with any additional concerns. ER/RTC precautions were reviewed with the patient and understanding verbalized.    Please note: voice recognition software was used to produce this document, and typos may escape review. Please contact Dr. Lyn Hollingshead for any needed clarifications.

## 2017-06-28 NOTE — Patient Instructions (Signed)
   Plan to return for nurse visit to verify home blood pressure cuff if it's more than one year old. In the meantime, be keeping a record of you rblood pressures at home and bring this with you to the visit with the nurse.   If your cuff is measuring within 5-10 points of ours AND your home numbers are less than 140/90 (ideally less than 130/80) then nothing else to do.   If your home blood pressure cuff is inaccurate or is accurate but measuring above goal, we will need to talk about medications.

## 2017-07-05 LAB — COMPLETE METABOLIC PANEL WITH GFR
AG RATIO: 2 (calc) (ref 1.0–2.5)
ALBUMIN MSPROF: 4.3 g/dL (ref 3.6–5.1)
ALT: 11 U/L (ref 9–46)
AST: 16 U/L (ref 10–35)
Alkaline phosphatase (APISO): 83 U/L (ref 40–115)
BILIRUBIN TOTAL: 0.9 mg/dL (ref 0.2–1.2)
BUN: 10 mg/dL (ref 7–25)
CHLORIDE: 105 mmol/L (ref 98–110)
CO2: 31 mmol/L (ref 20–32)
Calcium: 8.9 mg/dL (ref 8.6–10.3)
Creat: 0.82 mg/dL (ref 0.70–1.33)
GFR, EST AFRICAN AMERICAN: 115 mL/min/{1.73_m2} (ref >=60)
GFR, Est Non African American: 99 mL/min/{1.73_m2} (ref >=60)
GLOBULIN: 2.1 g/dL (ref 1.9–3.7)
Glucose, Bld: 99 mg/dL (ref 65–99)
POTASSIUM: 4 mmol/L (ref 3.5–5.3)
SODIUM: 140 mmol/L (ref 135–146)
Total Protein: 6.4 g/dL (ref 6.1–8.1)

## 2017-07-05 LAB — CBC
HEMATOCRIT: 43.4 % (ref 38.5–50.0)
Hemoglobin: 15.4 g/dL (ref 13.2–17.1)
MCH: 33.5 pg — AB (ref 27.0–33.0)
MCHC: 35.5 g/dL (ref 32.0–36.0)
MCV: 94.3 fL (ref 80.0–100.0)
MPV: 11.1 fL (ref 7.5–12.5)
PLATELETS: 192 10*3/uL (ref 140–400)
RBC: 4.6 10*6/uL (ref 4.20–5.80)
RDW: 11.7 % (ref 11.0–15.0)
WBC: 6.8 10*3/uL (ref 3.8–10.8)

## 2017-07-05 LAB — LIPID PANEL
Cholesterol: 173 mg/dL (ref <200)
HDL: 35 mg/dL — ABNORMAL LOW (ref >40)
LDL Cholesterol (Calc): 110 mg/dL (calc) — ABNORMAL HIGH
NON-HDL CHOLESTEROL (CALC): 138 mg/dL — AB (ref <130)
Total CHOL/HDL Ratio: 4.9 (calc) (ref <5.0)
Triglycerides: 164 mg/dL — ABNORMAL HIGH (ref <150)

## 2017-07-05 LAB — TSH: TSH: 2.65 m[IU]/L (ref 0.40–4.50)

## 2017-11-06 ENCOUNTER — Encounter: Payer: Self-pay | Admitting: Physician Assistant

## 2017-11-06 ENCOUNTER — Ambulatory Visit (INDEPENDENT_AMBULATORY_CARE_PROVIDER_SITE_OTHER): Payer: BLUE CROSS/BLUE SHIELD | Admitting: Physician Assistant

## 2017-11-06 VITALS — BP 131/81 | HR 86 | Temp 98.6°F | Ht 72.0 in | Wt 156.0 lb

## 2017-11-06 DIAGNOSIS — R21 Rash and other nonspecific skin eruption: Secondary | ICD-10-CM | POA: Diagnosis not present

## 2017-11-06 DIAGNOSIS — R238 Other skin changes: Secondary | ICD-10-CM

## 2017-11-06 DIAGNOSIS — L309 Dermatitis, unspecified: Secondary | ICD-10-CM | POA: Diagnosis not present

## 2017-11-06 MED ORDER — PREDNISONE 10 MG (48) PO TBPK: ORAL_TABLET | Freq: Every day | ORAL | 0 refills | Status: DC

## 2017-11-06 NOTE — Progress Notes (Addendum)
Punch Biopsy Procedure Note  Pre-operative Diagnosis: recurrent papular/bullous rash    Post-operative Diagnosis: awaiting pathology  Locations:right lower back  Indications: recurrent rash  Anesthesia: Xilocaine 2% w/epinephrine without added sodium bicarbonate  Procedure Details  History of allergy to iodine: no Patient informed of the risks (including bleeding and infection) and benefits of the  procedure and Verbal informed consent obtained.  The lesion and surrounding area was given a sterile prep using chlorhexidine and draped in the usual sterile fashion. The skin was then stretched perpendicular to the skin tension lines and  4mm punch biopsy was performed at the edge of the lesion. The resulting ellipse was then closed. The wound was closed with 4-0 Nylon using simple interrupted suture x 1. Antibiotic ointment and a sterile dressing applied. The specimen was sent for pathologic examination. The patient tolerated the procedure well.   Condition: Stable  Complications: none.  Plan: 1. Instructed to keep the wound dry and covered for 24-48h and clean thereafter. 2. Warning signs of infection were reviewed.   3. Recommended that the patient use OTC analgesics as needed for pain.  4. Return for suture removal in 2 weeks.   Levonne Hubertharley E. Darinda [place] PA-C

## 2017-11-06 NOTE — Patient Instructions (Signed)
Skin Biopsy, Care After Refer to this sheet in the next few weeks. These instructions provide you with information about caring for yourself after your procedure. Your health care provider may also give you more specific instructions. Your treatment has been planned according to current medical practices, but problems sometimes occur. Call your health care provider if you have any problems or questions after your procedure. What can I expect after the procedure? After the procedure, it is common to have:  Soreness.  Bruising.  Itching.  Follow these instructions at home:  Rest and then return to your normal activities as told by your health care provider.  Take over-the-counter and prescription medicines only as told by your health care provider.  Follow instructions from your health care provider about how to take care of your biopsy site.Make sure you: ? Wash your hands with soap and water before you change your bandage (dressing). If soap and water are not available, use hand sanitizer. ? Change your dressing as told by your health care provider. ? Leave stitches (sutures), skin glue, or adhesive strips in place. These skin closures may need to stay in place for 2 weeks or longer. If adhesive strip edges start to loosen and curl up, you may trim the loose edges. Do not remove adhesive strips completely unless your health care provider tells you to do that. If the biopsy area bleeds, apply gentle pressure for 10 minutes.  Check your biopsy site every day for signs of infection. Check for: ? More redness, swelling, or pain. ? More fluid or blood. ? Warmth. ? Pus or a bad smell.  Keep all follow-up visits as told by your health care provider. This is important. Contact a health care provider if:  You have more redness, swelling, or pain around your biopsy site.  You have more fluid or blood coming from your biopsy site.  Your biopsy site feels warm to the touch.  You have pus or  a bad smell coming from your biopsy site.  You have a fever. Get help right away if:  You have bleeding that does not stop with pressure or a dressing. This information is not intended to replace advice given to you by your health care provider. Make sure you discuss any questions you have with your health care provider. Document Released: 04/17/2015 Document Revised: 11/15/2015 Document Reviewed: 06/18/2014 Elsevier Interactive Patient Education  2018 Elsevier Inc.  

## 2017-11-06 NOTE — Progress Notes (Signed)
   Subjective:    Patient ID: Jeffery Callahan, male    DOB: 10-03-1960, 57 y.o.   MRN: [id number]  HPI Pt is a 57 yr old male presenting to the clinic with a recurrent rash.   The rash is widespread in both upper, lower extremity and back appearing at varying degrees. Described as pruritic and tender. Rash begins as red bumps that evolve into larger areas of redness before blistering. Currently has a fluid-filled, painful blister of the right ankle. Reports 2 years ago he also had inguinal lymphadenopathy with the rash, but denies any lymphadenopathy now. Denies new animal/ insect exposure, new medications, no suspect food intake, no recent travel, no history of allergies. Denies fever, chills, myalgia, arthralgia, joint swelling, no URI symptoms, no CP, SOB.  Pt was evaluated for this 09/2015 with unknown etiology. He has had a neg rheumatology panel including ANA, CCP, RF, ESR. A wound culture from 09/2015 showed PMNs. Pt was sent to dermatology for this rash but says they were unsure of diagnosis. Unable to get the records from this.   He has been treated in the past with Doxyclcine and high potency topical steroid.    Review of Systems  Constitutional: Negative for chills, fatigue and fever.  HENT: Negative.  Negative for mouth sores and sore throat.   Eyes: Negative.   Respiratory: Negative for cough and shortness of breath.   Cardiovascular: Negative.  Negative for chest pain.  Musculoskeletal: Negative.  Negative for arthralgias and myalgias.  Skin: Positive for rash.  Neurological: Negative.        Objective:   Physical Exam  Constitutional: He is oriented to person, place, and time. He appears well-developed and well-nourished. No distress.  HENT:  Head: Normocephalic and atraumatic.  Eyes: Pupils are equal, round, and reactive to light. Conjunctivae and EOM are normal.  Neck: Normal range of motion.  Cardiovascular: Normal rate, regular rhythm and normal heart sounds.   Pulmonary/Chest: Effort normal and breath sounds normal.  Neurological: He is alert and oriented to person, place, and time.  Skin: Skin is warm and dry. Rash (Widespread erythematous papular rash in both upper and lower extremity and back. 2. Bullous rash on right posterior lower leg. ) noted.  Widespread erythematous lesions of the trunk and lower extremities, poorly circumscribed, varying in size from 56m-2cm Right lateral ankle just posterior to the lateral malleous there is a 1cm bulla       Assessment & Plan:  .TMarvellwas seen today for rash.  Diagnoses and all orders for this visit:  Rash and nonspecific skin eruption -     predniSONE (STERAPRED UNI-PAK 48 TAB) 10 MG (48) TBPK tablet; Take by mouth daily. Use as directed for taper -     Dermatology pathology  Punch biopsy of lesion on right lower back performed in office today (see procedure note). Pt tolerated the procedure well. Derm path pending. Rash:. Differential diagnosis includes autoimmune bullous dermatoses. Prior rheum work-up and wound culture unremarkable. Given widespread nature of rash, will treat with oral prednisone taper

## 2017-11-08 ENCOUNTER — Encounter: Payer: Self-pay | Admitting: Physician Assistant

## 2017-11-08 DIAGNOSIS — L308 Other specified dermatitis: Secondary | ICD-10-CM | POA: Insufficient documentation

## 2017-11-08 NOTE — Progress Notes (Signed)
Biopsy shows perivascular dermatitis. The pathologist recommends that we repeat the biopsy to look at it under a special stain that can tell us if this is a bullous skin disorder Recommend he see Dr. Lyn HollingsheadAlexander on Friday for another biopsy.  Barb MerinoNatalie, Kelsi is ordering the Michele/Immunofluoresence specimen containers from [facility] today. Cannot use the regular derm path specimen cups for this test. If we do not have them in time, patient will need to be rescheduled.

## 2017-11-10 ENCOUNTER — Encounter: Payer: Self-pay | Admitting: Osteopathic Medicine

## 2017-11-10 ENCOUNTER — Ambulatory Visit (INDEPENDENT_AMBULATORY_CARE_PROVIDER_SITE_OTHER): Payer: BLUE CROSS/BLUE SHIELD | Admitting: Osteopathic Medicine

## 2017-11-10 VITALS — BP 164/93 | HR 86 | Temp 98.6°F | Wt 161.4 lb

## 2017-11-10 DIAGNOSIS — L139 Bullous disorder, unspecified: Secondary | ICD-10-CM | POA: Diagnosis not present

## 2017-11-10 DIAGNOSIS — L308 Other specified dermatitis: Secondary | ICD-10-CM | POA: Diagnosis not present

## 2017-11-10 NOTE — Progress Notes (Signed)
HPI: Jeffery Callahan is a 57 y.o. male who  has a past medical history of Emphysema lung (HCC) (06/09/2017), Lung nodule seen on imaging study (06/06/2016), and Positive FIT (fecal immunochemical test) (05/17/2016).  he presents to [facility] today, 11/10/17,  for chief complaint of:  Biopsy   Seen by colleague 11/06/2017 for rash -notes reviewed, widespread and upper, lower extremity and back.  Described as itchy, tender.  Started as red bumps that evolved into larger areas of redness and then blistering.  Per my discussion with the patient today, all of this started after presumed [place], has a few places that are just rash, others that are bolus type blisters.  Previous negative rheumatology panel.  Dermatologic records unavailable, etiology uncertain at that point as well.    Recent pathology from biopsy site of bullous lesion on ankle showed perivascular dermatitis, recommended repeat biopsy under immunofluorescence staining.  Patient is here for repeat biopsy procedure.      Past medical history, surgical history, and family history reviewed.  Current medication list and allergy/intolerance information reviewed.   (See remainder of HPI, ROS, Phys Exam below)    ASSESSMENT/PLAN:   Bullous skin disease - Plan: Dermatology pathology    PRE-OP DIAGNOSIS: Abnormal Skin Lesion - bullous lesion L medial ankle POST-OP DIAGNOSIS: Same  PROCEDURE: skin biopsy Performing Physician: Sunnie Nielsen   PROCEDURE:  Punch (Size 4mm)   The area surrounding the skin lesion was prepared and draped in the usual sterile manner. Anesthesia with 0.5 cc Lido/Epi 1%. The lesion was removed in the usual manner by the biopsy method noted above. Hemostasis was assured. The patient tolerated the procedure well.   Closure:      4.0 prolene simple interrupted x2   Followup: The patient tolerated the procedure well without  complications.  Standard post-procedure care is explained and return precautions are given.     Follow-up plan: Return in about 10 days (around 11/20/2017) for suture removal (10-14 days) - sooner if needed .     ############################################ ############################################ ############################################ ############################################    Outpatient Encounter Medications as of 11/10/2017  Medication Sig  . ASHWAGANDHA PO Take by mouth.  Marland Kitchen aspirin 81 MG tablet Take 81 mg by mouth daily.  . COCONUT OIL PO Take by mouth.  Marland Kitchen KRILL OIL PO Take by mouth.  . Multiple Vitamin (MULTIVITAMIN) capsule Take 1 capsule by mouth daily.  . predniSONE (STERAPRED UNI-PAK 48 TAB) 10 MG (48) TBPK tablet Take by mouth daily. Use as directed for taper   No facility-administered encounter medications on file as of 11/10/2017.    No Known Allergies    Review of Systems:  Constitutional: No recent illness  HEENT: No  headache, no vision change  Cardiac: No  chest pain, No  pressure, No palpitations  Respiratory:  No  shortness of breath. No  Cough  Gastrointestinal: No  abdominal pain, no change on bowel habits  Musculoskeletal: No new myalgia/arthralgia  Skin: +Rash  Hem/Onc: No  easy bruising/bleeding  Neurologic: No  weakness, No  Dizziness   Exam:  BP (!) 164/93 (BP Location: Right Arm, Patient Position: Sitting, Cuff Size: Normal)   Pulse 86   Temp 98.6 F (37 C) (Oral)   Wt 161 lb 6.4 oz (73.2 kg)   BMI 21.89 kg/m   Constitutional: VS see above. General Appearance: alert, well-developed, well-nourished, NAD  Eyes: Normal lids and conjunctive, non-icteric sclera  Ears, Nose,  Mouth, Throat: MMM, Normal external inspection ears/nares/mouth/lips/gums.  Neck: No masses, trachea midline.   Respiratory: Normal respiratory effort. no wheeze, no rhonchi, no rales  Cardiovascular: S1/S2 normal, no murmur, no rub/gallop  auscultated. RRR.   Musculoskeletal: Gait normal. Symmetric and independent movement of all extremities  Neurological: Normal balance/coordination. No tremor.  Skin: warm, dry.  Scattered spots of irregular slightly erythematous patches, 2 bullous lesions, one on left lower medial ankle, one on right lateral ankle that is a bit larger and appears irritated.  Psychiatric: Normal judgment/insight. Normal mood and affect. Oriented x3.   Visit summary with medication list and pertinent instructions was printed for patient to review, advised to alert us if any changes needed. All questions at time of visit were answered - patient instructed to contact office with any additional concerns. ER/RTC precautions were reviewed with the patient and understanding verbalized.   Follow-up plan: Return in about 10 days (around 11/20/2017) for suture removal (10-14 days) - sooner if needed .  Note: Total time spent 25 minutes, greater than 50% of the visit was spent face-to-face counseling and coordinating care for the following: The encounter diagnosis was Bullous skin disease.Marland Kitchen.  Please note: voice recognition software was used to produce this document, and typos may escape review. Please contact Dr. Lyn HollingsheadAlexander for any needed clarifications.

## 2017-11-16 ENCOUNTER — Encounter: Payer: Self-pay | Admitting: Osteopathic Medicine

## 2017-11-16 ENCOUNTER — Ambulatory Visit (INDEPENDENT_AMBULATORY_CARE_PROVIDER_SITE_OTHER): Payer: BLUE CROSS/BLUE SHIELD | Admitting: Osteopathic Medicine

## 2017-11-16 VITALS — BP 148/91 | HR 61 | Temp 98.2°F | Wt 155.6 lb

## 2017-11-16 DIAGNOSIS — L139 Bullous disorder, unspecified: Secondary | ICD-10-CM

## 2017-11-16 DIAGNOSIS — R21 Rash and other nonspecific skin eruption: Secondary | ICD-10-CM | POA: Diagnosis not present

## 2017-11-16 NOTE — Progress Notes (Signed)
HPI: Jeffery Callahan is a 57 y.o. male who  has a past medical history of Emphysema lung (HCC) (06/09/2017), Lung nodule seen on imaging study (06/06/2016), and Positive FIT (fecal immunochemical test) (05/17/2016).  [facility] today, 11/16/17,  for chief complaint of:  Skin problem   Seen by colleague 11/06/2017 for rash -notes reviewed, widespread and upper, lower extremity and back.  Described as itchy, tender.  Started as red bumps that evolved into larger areas of redness and then blistering.  Per my discussion with the patient today, all of this started after presumed [place], has a few places that are just rash, others that are bolus type blisters.  Previous negative rheumatology panel.  Dermatologic records unavailable, etiology uncertain at that point as well.    Recent pathology from biopsy site of bullous lesion on ankle showed perivascular dermatitis, recommended repeat biopsy under immunofluorescence staining. Repeat biopsy showed nothing on immune stains.   Here today because he felt he was not getting particularly better.  He also needs to extend his absence from work.  The blister on his right ankle person is still a bit raw.      Past medical history, surgical history, and family history reviewed.  Current medication list and allergy/intolerance information reviewed.   (See remainder of HPI, ROS, Phys Exam below)    ASSESSMENT/PLAN: Not sure what to make of apparent bullous disease and reaction to insect bite, some lesions did not develop any blistering.  Nothing specific other than the perivascular dermatitis which was a bit concerning for needing immunochemical staining, however pathology report initially also put arthropod bite in the diagnosis differential, which is consistent with patient history.  Patient is concerned, will send for dermatology second opinion.  Bullous skin disease -  Plan: Ambulatory referral to Dermatology  Rash and nonspecific skin eruption - Plan: Ambulatory referral to Dermatology     Follow-up plan: Return for Recheck as needed/when due for annual physical.               ############################################ ############################################ ############################################ ############################################    Outpatient Encounter Medications as of 11/16/2017  Medication Sig  . ASHWAGANDHA PO Take by mouth.  Marland Kitchen. aspirin 81 MG tablet Take 81 mg by mouth daily.  . COCONUT OIL PO Take by mouth.  Marland Kitchen. KRILL OIL PO Take by mouth.  . Multiple Vitamin (MULTIVITAMIN) capsule Take 1 capsule by mouth daily.  . predniSONE (STERAPRED UNI-PAK 48 TAB) 10 MG (48) TBPK tablet Take by mouth daily. Use as directed for taper   No facility-administered encounter medications on file as of 11/16/2017.    No Known Allergies    Review of Systems:  Constitutional: No recent illness  HEENT: No  headache, no vision change  Cardiac: No  chest pain, No  pressure, No palpitations  Respiratory:  No  shortness of breath. No  Cough  Musculoskeletal: No new myalgia/arthralgia  Skin: +Rash  Hem/Onc: No  easy bruising/bleeding    Exam:  BP (!) 148/91 (BP Location: Left Arm, Patient Position: Sitting, Cuff Size: Normal)   Pulse 61   Temp 98.2 F (36.8 C) (Oral)   Wt 155 lb 9.6 oz (70.6 kg)   BMI 21.10 kg/m   Constitutional: VS see above. General Appearance: alert, well-developed, well-nourished, NAD  Eyes: Normal lids and conjunctive, non-icteric sclera  Ears, Nose, Mouth, Throat: MMM, Normal external inspection ears/nares/mouth/lips/gums.  Neck: No masses, trachea midline.  Respiratory: Normal respiratory effort.   Neurological: Normal balance/coordination. No tremor.  Skin: warm, dry.  Scattered spots of irregular slightly erythematous patches appear improved from previous exam.  Sutures intact  and incisions appear to be healing normally.  Psychiatric: Normal judgment/insight. Normal mood and affect. Oriented x3.       Visit summary with medication list and pertinent instructions was printed for patient to review, advised to alert us if any changes needed. All questions at time of visit were answered - patient instructed to contact office with any additional concerns. ER/RTC precautions were reviewed with the patient and understanding verbalized.   Follow-up plan: Return for Recheck as needed/when due for annual physical.  Note: Total time spent 15 minutes, greater than 50% of the visit was spent face-to-face counseling and coordinating care for the following: The primary encounter diagnosis was Bullous skin disease. A diagnosis of Rash and nonspecific skin eruption was also pertinent to this visit.Marland Kitchen.  Please note: voice recognition software was used to produce this document, and typos may escape review. Please contact Dr. Lyn HollingsheadAlexander for any needed clarifications.

## 2017-11-20 ENCOUNTER — Ambulatory Visit: Payer: BLUE CROSS/BLUE SHIELD | Admitting: Physician Assistant

## 2017-11-22 ENCOUNTER — Encounter: Payer: Self-pay | Admitting: Osteopathic Medicine

## 2017-11-22 ENCOUNTER — Ambulatory Visit (INDEPENDENT_AMBULATORY_CARE_PROVIDER_SITE_OTHER): Payer: BLUE CROSS/BLUE SHIELD | Admitting: Osteopathic Medicine

## 2017-11-22 VITALS — BP 156/83 | HR 73 | Temp 98.1°F | Ht 72.0 in | Wt 156.0 lb

## 2017-11-22 DIAGNOSIS — L139 Bullous disorder, unspecified: Secondary | ICD-10-CM

## 2017-11-22 DIAGNOSIS — Z4802 Encounter for removal of sutures: Secondary | ICD-10-CM | POA: Diagnosis not present

## 2017-11-22 NOTE — Progress Notes (Signed)
S: Here for suture removal. Rash is slowly resolving. BP a bit elevated on intake, he is nervous about the suture removal hurting.   O: BP (!) 156/83   Pulse 73   Temp 98.1 F (36.7 C) (Oral)   Ht 6' (1.829 m)   Wt 156 lb (70.8 kg)   BMI 21.16 kg/m   CV: no CP/SOB, no HA/VC.  Skin: biopsy sites healing appropriately. No new rash. Warm, dry.   A/P: Bullous skin disease s/p biopsies x2, uncertain etiology based on pathology, was referred to dermatology. Sutures removed today without difficulty.

## 2017-11-24 ENCOUNTER — Telehealth: Payer: Self-pay

## 2017-11-24 NOTE — Telephone Encounter (Signed)
As per pt, the nex available appt he was able to get with [facility] is 01/02/18. Pt went ahead and scheduled his appt, but wants to know if provider wants him to be seen sooner. Pls advise, thanks.

## 2017-11-25 NOTE — Telephone Encounter (Signed)
It's up to him. The blisters have mostly resolved at this point, and if they go away enirely I'm not sure dermatology will have much to offer anyway, though it might be worth establishing with them so he can be seen in the future if any recurrence of the rash. If he has photos to show dermatology, that would be helpful for them.   I guess it's up to him! If he'd like to get in sooner, we can try to get him in?

## 2017-11-27 NOTE — Telephone Encounter (Signed)
Unable to contact pt, phone line currently down. Will attempt to contact pt later on today.

## 2017-11-28 NOTE — Telephone Encounter (Signed)
Left a detailed vm msg for pt regarding provider's note. Direct call back information provided.  

## 2017-12-25 ENCOUNTER — Ambulatory Visit: Payer: BLUE CROSS/BLUE SHIELD | Admitting: Osteopathic Medicine

## 2018-01-01 ENCOUNTER — Ambulatory Visit: Payer: BLUE CROSS/BLUE SHIELD | Admitting: Osteopathic Medicine

## 2018-01-02 DIAGNOSIS — S90861A Insect bite (nonvenomous), right foot, initial encounter: Secondary | ICD-10-CM | POA: Diagnosis not present

## 2018-01-02 DIAGNOSIS — S90862A Insect bite (nonvenomous), left foot, initial encounter: Secondary | ICD-10-CM | POA: Diagnosis not present

## 2018-01-02 DIAGNOSIS — L579 Skin changes due to chronic exposure to nonionizing radiation, unspecified: Secondary | ICD-10-CM | POA: Diagnosis not present

## 2018-01-08 ENCOUNTER — Ambulatory Visit (INDEPENDENT_AMBULATORY_CARE_PROVIDER_SITE_OTHER): Payer: BLUE CROSS/BLUE SHIELD | Admitting: Osteopathic Medicine

## 2018-01-08 ENCOUNTER — Encounter: Payer: Self-pay | Admitting: Osteopathic Medicine

## 2018-01-08 VITALS — BP 138/75 | HR 66 | Temp 98.4°F | Wt 166.6 lb

## 2018-01-08 DIAGNOSIS — L139 Bullous disorder, unspecified: Secondary | ICD-10-CM | POA: Diagnosis not present

## 2018-01-08 DIAGNOSIS — Z23 Encounter for immunization: Secondary | ICD-10-CM | POA: Diagnosis not present

## 2018-01-08 NOTE — Progress Notes (Signed)
HPI: Jeffery Callahan is a 57 y.o. male who  has a past medical history of Emphysema lung (HCC) (06/09/2017), Lung nodule seen on imaging study (06/06/2016), and Positive FIT (fecal immunochemical test) (05/17/2016).  he presents to [facility] today, 01/08/18,  for chief complaint of:  Skin follow-up, needs paperwork filled out  Recently saw dermatology, was started on some sort of antibiotic as well as clobetasol cream which seems to have really helped the rash after a brief repeat flare/blistering.  No complaints at this time.  Blood pressure improved, no chest pain, pressure, shortness of breath, dizziness, headache.  BP Readings from Last 3 Encounters:  01/08/18 138/75  11/22/17 (!) 156/83  11/16/17 (!) 148/91      Past medical history, surgical history, and family history reviewed.  Current medication list and allergy/intolerance information reviewed.   (See remainder of HPI, ROS, Phys Exam below)   ASSESSMENT/PLAN:   Bullous skin disease  Need for influenza vaccination - Plan: Flu Vaccine QUAD 6+ mos PF IM (Fluarix Quad PF)     Patient Instructions  Will complete paperwork and leave a copy up front for you, fax the other copy where it needs to go. Please confirm early next week that it was received by your employer/disability folks.   Will fax pathology reports to [facility].   Any questions, let us know!   Follow-up plan: Return for [facility] PHYSICAL MARCH 2020.            ############################################ ############################################ ############################################ ############################################    Outpatient Encounter Medications as of 01/08/2018  Medication Sig  . ASHWAGANDHA PO Take by mouth.  Marland Kitchen aspirin 81 MG tablet Take 81 mg by mouth daily.  . clobetasol cream (TEMOVATE) 0.05 % APPLY TO AFFECTED AREA TWICE DAILY FOR UP TO 2 WEEKS. NOT TO FACE.  Marland Kitchen COCONUT OIL PO Take  by mouth.  Marland Kitchen KRILL OIL PO Take by mouth.  . Multiple Vitamin (MULTIVITAMIN) capsule Take 1 capsule by mouth daily.  . mupirocin ointment (BACTROBAN) 2 % APPLY TO OPEN SORES TWICE DAILY UNTIL HEALED   No facility-administered encounter medications on file as of 01/08/2018.    No Known Allergies    Review of Systems:  Constitutional: No recent illness  HEENT: No  headache, no vision change  Cardiac: No  chest pain, No  pressure, No palpitations  Respiratory:  No  shortness of breath. No  Cough  Musculoskeletal: No new myalgia/arthralgia  Skin: +improved Rash  Hem/Onc: No  easy bruising/bleeding, No  abnormal lumps/bumps  Exam:  BP 138/75 (BP Location: Left Arm, Patient Position: Sitting, Cuff Size: Normal)   Pulse 66   Temp 98.4 F (36.9 C) (Oral)   Wt 166 lb 9.6 oz (75.6 kg)   BMI 22.60 kg/m   Constitutional: VS see above. General Appearance: alert, well-developed, well-nourished, NAD  Eyes: Normal lids and conjunctive, non-icteric sclera  Ears, Nose, Mouth, Throat: MMM, Normal external inspection ears/nares/mouth/lips/gums.  Neck: No masses, trachea midline.   Respiratory: Normal respiratory effort.   Musculoskeletal: Gait normal. Symmetric and independent movement of all extremities  Neurological: Normal balance/coordination. No tremor.  Skin: warm, dry, intact. Healing from previous bx site on torso and L ankle   Psychiatric: Normal judgment/insight. Normal mood and affect. Oriented x3.   Visit summary with medication list and pertinent instructions was printed for patient to review, advised to alert [place] if any changes needed. All questions at time of visit were answered - patient instructed to contact office with  any additional concerns. ER/RTC precautions were reviewed with the patient and understanding verbalized.   Follow-up plan: Return for [facility] PHYSICAL MARCH 2020.  Note: Total time spent 15 minutes, greater than 50% of the visit was spent face-to-face  counseling and coordinating care for the following: The primary encounter diagnosis was Bullous skin disease. A diagnosis of Need for influenza vaccination was also pertinent to this visit.Marland Kitchen  Please note: voice recognition software was used to produce this document, and typos may escape review. Please contact Dr. Lyn Hollingshead for any needed clarifications.

## 2018-01-08 NOTE — Patient Instructions (Signed)
Will complete paperwork and leave a copy up front for you, fax the other copy where it needs to go. Please confirm early next week that it was received by your employer/disability folks.   Will fax pathology reports to [facility]   Any questions, let us know!

## 2018-01-09 ENCOUNTER — Encounter: Payer: Self-pay | Admitting: Osteopathic Medicine

## 2018-02-13 DIAGNOSIS — L819 Disorder of pigmentation, unspecified: Secondary | ICD-10-CM | POA: Diagnosis not present

## 2018-02-13 DIAGNOSIS — L579 Skin changes due to chronic exposure to nonionizing radiation, unspecified: Secondary | ICD-10-CM | POA: Diagnosis not present

## 2018-02-13 DIAGNOSIS — L814 Other melanin hyperpigmentation: Secondary | ICD-10-CM | POA: Diagnosis not present

## 2018-02-13 DIAGNOSIS — R21 Rash and other nonspecific skin eruption: Secondary | ICD-10-CM | POA: Diagnosis not present

## 2018-03-23 ENCOUNTER — Encounter: Payer: Self-pay | Admitting: Physician Assistant

## 2018-03-23 ENCOUNTER — Ambulatory Visit (INDEPENDENT_AMBULATORY_CARE_PROVIDER_SITE_OTHER): Payer: BLUE CROSS/BLUE SHIELD | Admitting: Physician Assistant

## 2018-03-23 VITALS — BP 134/84 | HR 64 | Wt 162.0 lb

## 2018-03-23 DIAGNOSIS — H00021 Hordeolum internum right upper eyelid: Secondary | ICD-10-CM | POA: Diagnosis not present

## 2018-03-23 MED ORDER — ERYTHROMYCIN 5 MG/GM OP OINT: 1.0000 "application " | TOPICAL_OINTMENT | Freq: Every day | OPHTHALMIC | 0 refills | Status: DC

## 2018-03-23 NOTE — Patient Instructions (Signed)
Increase frequency of warm compresses to every 4 hours Apply antibiotic ointment at night   Stye  A stye, also known as a hordeolum, is a bump that forms on an eyelid. It may look like a pimple next to the eyelash. A stye can form inside the eyelid (internal stye) or outside the eyelid (external stye). A stye can cause redness, swelling, and pain on the eyelid. Styes are very common. Anyone can get them at any age. They usually occur in just one eye, but you may have more than one in either eye. What are the causes? A stye is caused by an infection. The infection is almost always caused by bacteria called Staphylococcus aureus. This is a common type of bacteria that lives on the skin. An internal stye may result from an infected oil-producing gland inside the eyelid. An external stye may be caused by an infection at the base of the eyelash (hair follicle). What increases the risk? You are more likely to develop a stye if:  You have had a stye before.  You have any of these conditions: ? Diabetes. ? Red, itchy, inflamed eyelids (blepharitis). ? A skin condition such as seborrheic dermatitis or rosacea. ? High fat levels in your blood (lipids). What are the signs or symptoms? The most common symptom of a stye is eyelid pain. Internal styes are more painful than external styes. Other symptoms may include:  Painful swelling of your eyelid.  A scratchy feeling in your eye.  Tearing and redness of your eye.  Pus draining from the stye. How is this diagnosed? Your health care provider may be able to diagnose a stye just by examining your eye. The health care provider may also check to make sure:  You do not have a fever or other signs of a more serious infection.  The infection has not spread to other parts of your eye or areas around your eye. How is this treated? Most styes will clear up in a few days without treatment or with warm compresses applied to the area. You may need to use  antibiotic drops or ointment to treat an infection. In some cases, if your stye does not heal with routine treatment, your health care provider may drain pus from the stye using a thin blade or needle. This may be done if the stye is large, causing a lot of pain, or affecting your vision. Follow these instructions at home:  Take over-the-counter and prescription medicines only as told by your health care provider. This includes eye drops or ointments.  If you were prescribed an antibiotic medicine, apply or use it as told by your health care provider. Do not stop using the antibiotic even if your condition improves.  Apply a warm, wet cloth (warm compress) to your eye for 5-10 minutes, 4 times a day.  Clean the affected eyelid as directed by your health care provider.  Do not wear contact lenses or eye makeup until your stye has healed.  Do not try to pop or drain the stye.  Do not rub your eye. Contact a health care provider if:  You have chills or a fever.  Your stye does not go away after several days.  Your stye affects your vision.  Your eyeball becomes swollen, red, or painful. Get help right away if:  You have pain when moving your eye around. Summary  A stye is a bump that forms on an eyelid. It may look like a pimple next to the  eyelash.  A stye can form inside the eyelid (internal stye) or outside the eyelid (external stye). A stye can cause redness, swelling, and pain on the eyelid.  Your health care provider may be able to diagnose a stye just by examining your eye.  Apply a warm, wet cloth (warm compress) to your eye for 5-10 minutes, 4 times a day. This information is not intended to replace advice given to you by your health care provider. Make sure you discuss any questions you have with your health care provider. Document Released: 12/29/2004 Document Revised: 12/01/2016 Document Reviewed: 12/01/2016 Elsevier Interactive Patient Education  2019 [employer]

## 2018-03-23 NOTE — Progress Notes (Signed)
HPI:                                                                Jeffery Callahan is a 57 y.o. male who presents to [facility] today for left eyelid swelling  Eye Problem   The right eye is affected. This is a new problem. The current episode started in the past 7 days (approx 1 week). The problem occurs constantly. The problem has been gradually worsening. There was no injury mechanism. The pain is mild. There is no known exposure to pink eye. He does not wear contacts. Associated symptoms include blurred vision and an eye discharge (scant amt in the am). Pertinent negatives include no double vision, eye redness, fever, foreign body sensation, itching or recent URI. Treatments tried: warm compresses at night.      [facility] 06/28/2017 04/26/2016  Decreased Interest 3 0  Down, Depressed, Hopeless 3 0  PHQ - 2 Score 6 0  Altered sleeping 3 -  Tired, decreased energy 3 -  Change in appetite 3 -  Feeling bad or failure about yourself  3 -  Trouble concentrating 3 -  Moving slowly or fidgety/restless 3 -  Suicidal thoughts 0 -  PHQ-9 Score 24 -  Difficult doing work/chores Not difficult at all -    GAD 7 : Generalized Anxiety Score 06/28/2017  Nervous, Anxious, on Edge 3  Control/stop worrying 3  Worry too much - different things 3  Trouble relaxing 3  Restless 3  Easily annoyed or irritable 3  Afraid - awful might happen 0  Total GAD 7 Score 18  Anxiety Difficulty Not difficult at all      Past Medical History:  Diagnosis Date  . Emphysema lung (HCC) 06/09/2017   Noted on CT scan March 2019  . Lung nodule seen on imaging study 06/06/2016   05/30/2016 "IMPRESSION: 2.2 mm subpleural nodule in the lingula. Lung-RADS Category 2, benign appearance or behavior. Continue annual screening with low-dose chest CT without contrast in 12 months."  . Positive FIT (fecal immunochemical test) 05/17/2016   Referral to GI placed  05/17/16    Past Surgical History:  Procedure Laterality Date  . HAND SURGERY Left   . SHOULDER SURGERY Right   . TONSILLECTOMY     Social History   Tobacco Use  . Smoking status: Current Every Day Smoker    Packs/day: 1.00    Years: 43.00    Pack years: 43.00  . Smokeless tobacco: Never Used  Substance Use Topics  . Alcohol use: Yes   family history includes Cancer in his maternal aunt and maternal grandmother; Depression in his mother; Stroke in his paternal uncle.    ROS: negative except as noted in the HPI  Medications: Current Outpatient Medications  Medication Sig Dispense Refill  . ASHWAGANDHA PO Take by mouth.    Marland Kitchen. aspirin 81 MG tablet Take 81 mg by mouth daily.    . clobetasol cream (TEMOVATE) 0.05 % APPLY TO AFFECTED AREA TWICE DAILY FOR UP TO 2 WEEKS. NOT TO FACE.  1  . COCONUT OIL PO Take by mouth.    . erythromycin ophthalmic ointment Place 1 application into the right eye at bedtime. Use for 3-7  days or until resolution of symptoms 3.5 g 0  . KRILL OIL PO Take by mouth.    . Multiple Vitamin (MULTIVITAMIN) capsule Take 1 capsule by mouth daily.    . mupirocin ointment (BACTROBAN) 2 % APPLY TO OPEN SORES TWICE DAILY UNTIL HEALED  2   No current facility-administered medications for this visit.    No Known Allergies     Objective:  BP 134/84   Pulse 64   Wt 162 lb (73.5 kg)   BMI 21.97 kg/m  Gen:  alert, not ill-appearing, no distress, appropriate for age HEENT: head normocephalic without obvious abnormality, conjunctiva and cornea clear, right medial upper eyelid margin Jeffery and swollen, examination of inner aspect of eyelid does not show an overt stye, EOM's intact, PERRL   No results found for this or any previous visit (from the past 72 hour(s)). No results found.    Assessment and Plan: 57 y.o. male with   .Jeffery Callahan was seen today for stye.  Diagnoses and all orders for this visit:  Hordeolum internum right upper eyelid -     Ambulatory  referral to Ophthalmology -     Discontinue: erythromycin ophthalmic ointment; Place 1 application into the left eye at bedtime. Use for 3-7 days or until resolution of symptoms -     erythromycin ophthalmic ointment; Place 1 application into the right eye at bedtime. Use for 3-7 days or until resolution of symptoms   Hordeolum versus chalazion Increase frequency of warm compresses to Q4H Erythromycin ointment QHS Given gradually worsening symptoms for >1 week, will refer to [facility] in case this requires I&D next week   Patient education and anticipatory guidance given Patient agrees with treatment plan Follow-up as needed if symptoms worsen or fail to improve  Jeffery Hubertharley E. Jocilyn Trego PA-C

## 2018-05-24 ENCOUNTER — Ambulatory Visit (INDEPENDENT_AMBULATORY_CARE_PROVIDER_SITE_OTHER): Payer: BLUE CROSS/BLUE SHIELD | Admitting: Osteopathic Medicine

## 2018-05-24 ENCOUNTER — Encounter: Payer: Self-pay | Admitting: Osteopathic Medicine

## 2018-05-24 VITALS — BP 139/81 | HR 65 | Temp 98.1°F | Wt 166.5 lb

## 2018-05-24 DIAGNOSIS — J069 Acute upper respiratory infection, unspecified: Secondary | ICD-10-CM | POA: Diagnosis not present

## 2018-05-24 DIAGNOSIS — B9789 Other viral agents as the cause of diseases classified elsewhere: Secondary | ICD-10-CM

## 2018-05-24 MED ORDER — IPRATROPIUM BROMIDE 0.06 % NA SOLN: 2.0000 | Freq: Four times a day (QID) | NASAL | 1 refills | Status: DC

## 2018-05-24 MED ORDER — GUAIFENESIN-CODEINE 100-10 MG/5ML PO SYRP: 5.0000 mL | ORAL_SOLUTION | Freq: Four times a day (QID) | ORAL | 0 refills | Status: DC | PRN

## 2018-05-24 NOTE — Progress Notes (Signed)
HPI: Jeffery Callahan is a 58 y.o. male who  has a past medical history of Emphysema lung (HCC) (06/09/2017), Lung nodule seen on imaging study (06/06/2016), and Positive FIT (fecal immunochemical test) (05/17/2016).  he presents to [facility] today, 05/24/18,  for chief complaint of: Sick - URI/flu-like  . Location: generalized / upper respiratory and nasal . Quality: cough/congestion  . Duration: 5 days . Timing: cough is worse at night.  . Assoc signs/symptoms: low-grade fever up to 67F, significant fatigue.  Dorothyann Peng is only Rx he's been taking      Immunization History  Administered Date(s) Administered  . Influenza, Seasonal, Injecte, Preservative Fre 01/02/2014, 02/14/2015  . Influenza,inj,Quad PF,6+ Mos 02/14/2015, 02/03/2017, 01/08/2018  . Influenza-Unspecified 03/03/2011, 01/06/2012, 01/08/2013, 01/08/2016  . Pneumococcal Polysaccharide-23 04/25/2016  . Tdap 09/23/2015      Past medical, surgical, social and family history reviewed and updated as necessary.   Current medication list and allergy/intolerance information reviewed:    Current Outpatient Medications  Medication Sig Dispense Refill  . ASHWAGANDHA PO Take by mouth.    Marland Kitchen aspirin 81 MG tablet Take 81 mg by mouth daily.    . COCONUT OIL PO Take by mouth.    Marland Kitchen KRILL OIL PO Take by mouth.    . Multiple Vitamin (MULTIVITAMIN) capsule Take 1 capsule by mouth daily.    . clobetasol cream (TEMOVATE) 0.05 % APPLY TO AFFECTED AREA TWICE DAILY FOR UP TO 2 WEEKS. NOT TO FACE.  1  . erythromycin ophthalmic ointment Place 1 application into the right eye at bedtime. Use for 3-7 days or until resolution of symptoms (Patient not taking: Reported on 05/24/2018) 3.5 g 0  . guaiFENesin-codeine (ROBITUSSIN AC) 100-10 MG/5ML syrup Take 5-10 mLs by mouth 4 (four) times daily as needed for cough. 180 mL 0  . ipratropium (ATROVENT) 0.06 % nasal spray Place 2 sprays into both nostrils 4 (four) times  daily. 15 mL 1  . mupirocin ointment (BACTROBAN) 2 % APPLY TO OPEN SORES TWICE DAILY UNTIL HEALED  2   No current facility-administered medications for this visit.     No Known Allergies    Review of Systems:  Constitutional:  +fever, no chills, +recent illness, No unintentional weight changes. +significant fatigue.   HEENT: +headache, no vision change, no hearing change, +sore throat, +sinus pressure  Cardiac: No  chest pain, No  pressure, No palpitations  Respiratory:  No  shortness of breath. +Cough  Gastrointestinal: No  abdominal pain, No  nausea, No  vomiting,  No  blood in stool, No  diarrhea  Musculoskeletal: +generalized myalgia, no arthralgia  Skin: No  Rash  Neurologic: No  weakness, No  dizziness  Exam:  BP 139/81 (BP Location: Left Arm, Patient Position: Sitting, Cuff Size: Normal)   Pulse 65   Temp 98.1 F (36.7 C) (Oral)   Wt 166 lb 8 oz (75.5 kg)   SpO2 100%   BMI 22.58 kg/m   Constitutional: VS see above. General Appearance: alert, well-developed, well-nourished, NAD  Eyes: Normal lids and conjunctive, non-icteric sclera  Ears, Nose, Mouth, Throat: MMM, Normal external inspection ears/nares/mouth/lips/gums. TM normal bilaterally. Pharynx/tonsils +erythema, no exudate. Nasal mucosa normal.   Neck: No masses, trachea midline. No tenderness/mass appreciated. No lymphadenopathy  Respiratory: Normal respiratory effort. no wheeze, no rhonchi, no rales  Cardiovascular: S1/S2 normal, no murmur, no rub/gallop auscultated. RRR. No lower extremity edema.   Musculoskeletal: Gait normal.   Neurological: Normal balance/coordination. No tremor.  Skin: warm, dry, intact.   Psychiatric: Normal judgment/insight. Normal mood and affect.      ASSESSMENT/PLAN: The encounter diagnosis was Viral URI with cough.   Meds ordered this encounter  Medications  . ipratropium (ATROVENT) 0.06 % nasal spray    Sig: Place 2 sprays into both nostrils 4 (four) times  daily.    Dispense:  15 mL    Refill:  1  . guaiFENesin-codeine (ROBITUSSIN AC) 100-10 MG/5ML syrup    Sig: Take 5-10 mLs by mouth 4 (four) times daily as needed for cough.    Dispense:  180 mL    Refill:  0      Patient Instructions  Medications & Home Remedies for Upper Respiratory Illness   Note: the following list assumes no pregnancy, normal liver & kidney function and no other drug interactions. Dr. Lyn Hollingshead has highlighted medications which are safe for you to use, but these may not be appropriate for everyone. Always ask a pharmacist or qualified medical provider if you have any questions!    Aches/Pains, Fever, Headache OTC Acetaminophen (Tylenol) 500 mg tablets - take max 2 tablets (1000 mg) every 6 hours (4 times per day)  OTC Ibuprofen (Motrin) 200 mg tablets - take max 4 tablets (800 mg) every 6 hours   Sinus Congestion Prescription Atrovent as directed OTC Nasal Saline if desired to rinse OTC Phenylephrine (Sudafed) 10 mg tablets every 4 hours (or the 12-hour formulation) OTC Diphenhydramine (Benadryl) 25 mg tablets - take max 2 tablets every 4 hours   Cough & Sore Throat Prescription cough pills or syrups as directed OTC Dextromethorphan (Robitussin, others) - cough suppressant OTC Guaifenesin (Robitussin, Mucinex, others) - expectorant (helps cough up mucus) (Dextromethorphan and Guaifenesin also come in a combination tablet/syrup) OTC Lozenges w/ Benzocaine + Menthol (Cepacol) Honey - as much as you want! Teas which "coat the throat" - look for ingredients Elm Bark, Licorice Root, Marshmallow Root   Other Prescription Oral Steroids to decrease inflammation  OTC Zinc Lozenges within 24 hours of symptoms onset - mixed evidence this shortens the duration of the common cold Don't waste your money on Vitamin C or Echinacea in acute illness - it's already too late!             Visit summary with medication list and pertinent instructions was printed for  patient to review. All questions at time of visit were answered - patient instructed to contact office with any additional concerns or updates. ER/RTC precautions were reviewed with the patient.   Follow-up plan: Return if symptoms worsen or fail to improve.  Please note: voice recognition software was used to produce this document, and typos may escape review. Please contact Dr. Lyn Hollingshead for any needed clarifications.

## 2018-05-24 NOTE — Patient Instructions (Addendum)
Medications & Home Remedies for Upper Respiratory Illness   Note: the following list assumes no pregnancy, normal liver & kidney function and no other drug interactions. Dr. Lyn Hollingshead has highlighted medications which are safe for you to use, but these may not be appropriate for everyone. Always ask a pharmacist or qualified medical provider if you have any questions!    Aches/Pains, Fever, Headache OTC Acetaminophen (Tylenol) 500 mg tablets - take max 2 tablets (1000 mg) every 6 hours (4 times per day)  OTC Ibuprofen (Motrin) 200 mg tablets - take max 4 tablets (800 mg) every 6 hours   Sinus Congestion Prescription Atrovent as directed OTC Nasal Saline if desired to rinse OTC Phenylephrine (Sudafed) 10 mg tablets every 4 hours (or the 12-hour formulation) OTC Diphenhydramine (Benadryl) 25 mg tablets - take max 2 tablets every 4 hours   Cough & Sore Throat Prescription cough pills or syrups as directed OTC Dextromethorphan (Robitussin, others) - cough suppressant OTC Guaifenesin (Robitussin, Mucinex, others) - expectorant (helps cough up mucus) (Dextromethorphan and Guaifenesin also come in a combination tablet/syrup) OTC Lozenges w/ Benzocaine + Menthol (Cepacol) Honey - as much as you want! Teas which "coat the throat" - look for ingredients Elm Bark, Licorice Root, Marshmallow Root   Other Prescription Oral Steroids to decrease inflammation  OTC Zinc Lozenges within 24 hours of symptoms onset - mixed evidence this shortens the duration of the common cold Don't waste your money on Vitamin C or Echinacea in acute illness - it's already too late!

## 2018-05-28 ENCOUNTER — Telehealth: Payer: Self-pay | Admitting: Osteopathic Medicine

## 2018-05-28 ENCOUNTER — Ambulatory Visit: Payer: BLUE CROSS/BLUE SHIELD | Admitting: Osteopathic Medicine

## 2018-05-28 MED ORDER — AMOXICILLIN-POT CLAVULANATE 875-125 MG PO TABS: 1.0000 | ORAL_TABLET | Freq: Two times a day (BID) | ORAL | 0 refills | Status: AC

## 2018-05-28 NOTE — Telephone Encounter (Signed)
Pt calling stating he is still sick, congestion is not better. Pt states they is no way he could work today ,probably not tomorrow either.Pt states he needs note to be out of work. Pt states if he needs to be seen again he can.

## 2018-05-28 NOTE — Telephone Encounter (Signed)
Can send abx Doesn't need to see me today but if worse despite abx will want to have a recheck

## 2018-05-30 ENCOUNTER — Telehealth: Payer: Self-pay

## 2018-05-30 DIAGNOSIS — Z0000 Encounter for general adult medical examination without abnormal findings: Secondary | ICD-10-CM

## 2018-05-30 DIAGNOSIS — E782 Mixed hyperlipidemia: Secondary | ICD-10-CM

## 2018-05-30 DIAGNOSIS — Z125 Encounter for screening for malignant neoplasm of prostate: Secondary | ICD-10-CM

## 2018-05-30 NOTE — Telephone Encounter (Signed)
Patient called for fasting labs for his up coming annual physical. Ordered labs.

## 2018-06-07 LAB — CBC WITH DIFFERENTIAL/PLATELET
Absolute Monocytes: 646 cells/uL (ref 200–950)
Basophils Absolute: 48 cells/uL (ref 0–200)
Basophils Relative: 0.5 %
EOS PCT: 1.6 %
Eosinophils Absolute: 152 cells/uL (ref 15–500)
HCT: 46 % (ref 38.5–50.0)
Hemoglobin: 16.2 g/dL (ref 13.2–17.1)
Lymphs Abs: 2442 cells/uL (ref 850–3900)
MCH: 33.6 pg — ABNORMAL HIGH (ref 27.0–33.0)
MCHC: 35.2 g/dL (ref 32.0–36.0)
MCV: 95.4 fL (ref 80.0–100.0)
MPV: 11 fL (ref 7.5–12.5)
Monocytes Relative: 6.8 %
Neutro Abs: 6213 cells/uL (ref 1500–7800)
Neutrophils Relative %: 65.4 %
Platelets: 227 10*3/uL (ref 140–400)
RBC: 4.82 10*6/uL (ref 4.20–5.80)
RDW: 11.8 % (ref 11.0–15.0)
Total Lymphocyte: 25.7 %
WBC: 9.5 10*3/uL (ref 3.8–10.8)

## 2018-06-07 LAB — COMPLETE METABOLIC PANEL WITH GFR
AG Ratio: 2 (calc) (ref 1.0–2.5)
ALT: 15 U/L (ref 9–46)
AST: 19 U/L (ref 10–35)
Albumin: 4.5 g/dL (ref 3.6–5.1)
Alkaline phosphatase (APISO): 78 U/L (ref 35–144)
BUN: 14 mg/dL (ref 7–25)
CO2: 27 mmol/L (ref 20–32)
CREATININE: 0.98 mg/dL (ref 0.70–1.33)
Calcium: 9.6 mg/dL (ref 8.6–10.3)
Chloride: 104 mmol/L (ref 98–110)
GFR, EST AFRICAN AMERICAN: 99 mL/min/{1.73_m2} (ref >=60)
GFR, Est Non African American: 85 mL/min/{1.73_m2} (ref >=60)
GLUCOSE: 100 mg/dL — AB (ref 65–99)
Globulin: 2.3 g/dL (calc) (ref 1.9–3.7)
Potassium: 4.4 mmol/L (ref 3.5–5.3)
Sodium: 141 mmol/L (ref 135–146)
TOTAL PROTEIN: 6.8 g/dL (ref 6.1–8.1)
Total Bilirubin: 0.6 mg/dL (ref 0.2–1.2)

## 2018-06-07 LAB — LIPID PANEL W/REFLEX DIRECT LDL
Cholesterol: 190 mg/dL (ref <200)
HDL: 32 mg/dL — ABNORMAL LOW (ref >=40)
LDL Cholesterol (Calc): 139 mg/dL (calc) — ABNORMAL HIGH
NON-HDL CHOLESTEROL (CALC): 158 mg/dL — AB (ref <130)
Total CHOL/HDL Ratio: 5.9 (calc) — ABNORMAL HIGH (ref <5.0)
Triglycerides: 89 mg/dL (ref <150)

## 2018-06-07 LAB — PSA: PSA: 2.8 ng/mL (ref <=4.0)

## 2018-06-07 LAB — TEST AUTHORIZATION

## 2018-06-07 LAB — HEMOGLOBIN A1C W/OUT EAG: Hgb A1c MFr Bld: 5.3 % of total Hgb (ref <5.7)

## 2018-06-11 ENCOUNTER — Encounter: Payer: Self-pay | Admitting: Osteopathic Medicine

## 2018-06-11 ENCOUNTER — Ambulatory Visit (INDEPENDENT_AMBULATORY_CARE_PROVIDER_SITE_OTHER): Payer: BLUE CROSS/BLUE SHIELD | Admitting: Osteopathic Medicine

## 2018-06-11 VITALS — BP 148/75 | HR 76 | Temp 98.2°F | Wt 164.4 lb

## 2018-06-11 DIAGNOSIS — Z0000 Encounter for general adult medical examination without abnormal findings: Secondary | ICD-10-CM

## 2018-06-11 DIAGNOSIS — R030 Elevated blood-pressure reading, without diagnosis of hypertension: Secondary | ICD-10-CM

## 2018-06-11 DIAGNOSIS — Z7189 Other specified counseling: Secondary | ICD-10-CM | POA: Diagnosis not present

## 2018-06-11 NOTE — Patient Instructions (Signed)
General Preventive Care  Most recent routine screening lipids/other labs: see attached  Everyone should have blood pressure checked once per year. Goal: 130/80 or less  Tobacco: don't! Please let me know if you need help quitting!  Alcohol: responsible moderation is ok for most adults - if you have concerns about your alcohol intake, please talk to me!   Exercise: as tolerated to reduce risk of cardiovascular disease and diabetes. Strength training will also prevent osteoporosis.   Mental health: if need for mental health care (medicines, counseling, other), or concerns about moods, please let me know!   Sexual health: if need for STD testing, or if concerns with libido/pain problems, please let me know!  Advanced Directive: Living Will and/or Healthcare Power of Attorney recommended for all adults, regardless of age or health.  Vaccines  Flu vaccine: recommended for almost everyone, every fall.   Shingles vaccine: Shingrix recommended after age 80. Will put you on our clinic waiting list.   Pneumonia vaccines: Prevnar and Pneumovax recommended after age 45, or sooner if certain medical conditions/smoker.  Tetanus booster: Tdap recommended every 10 years. Due 09/2025.  Cancer screenings   Colon cancer screening: Colonoscopy repeat due 06/2019  Prostate cancer screening: PSA blood test annually age 61-71 - normal   Lung cancer screening: CT chest every year for those age 14 to 58 years old with ?30 pack year smoking history, who either currently smoke or have quit within the past 15 years. Continue current screenings!  Infection screenings . HIV, Gonorrhea/Chlamydia: screening as needed. HIV already done!  . Hepatitis C: recommended for anyone born 21-1965. Already done!  . TB: certain at-risk populations, or depending on work requirements and/or travel history Other . Bone Density Test: recommended for men at age 38, sooner depending on risk factors . Abdominal Aortic  Aneurysm: screening with ultrasound recommended once for men age 38-75 who have ever smoked

## 2018-06-11 NOTE — Progress Notes (Signed)
HPI: Jeffery Callahan is a 58 y.o. male who  has a past medical history of Emphysema lung (HCC) (06/09/2017), Lung nodule seen on imaging study (06/06/2016), and Positive FIT (fecal immunochemical test) (05/17/2016).  he presents to [facility] today, 06/11/18,  for chief complaint of: Annual physical    Patient here for annual physical / wellness exam.  See preventive care reviewed as below.  Recent labs reviewed in detail with the patient.  Statin benefit goup given cholesterol as well as risk factors BP and smoking - delclined    Additional concerns today include:   Reports (+)PHQ/GAD d/t stress, fighting ongoing with [employer] re: land they are trying to force him off of.      Past medical, surgical, social and family history reviewed:  Patient Active Problem List   Diagnosis Date Noted  . Superficial perivascular dermatitis 11/08/2017  . Elevated blood pressure reading 06/13/2017  . 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 Emphysema lung (HCC) 06/09/2017  . Aortic atherosclerosis (HCC) 06/09/2017  . Lung nodule seen on imaging study 06/06/2016  . Positive FIT (fecal immunochemical test) 05/17/2016  . Tobacco abuse 04/28/2016  . Personal history of tobacco use, presenting hazards to health 04/28/2016  . Rash and nonspecific skin eruption 10/22/2015  . Blisters of multiple sites 09/23/2015  . Infected sebaceous cyst 08/06/2015    Past Surgical History:  Procedure Laterality Date  . HAND SURGERY Left   . SHOULDER SURGERY Right   . TONSILLECTOMY      Social History   Tobacco Use  . Smoking status: Current Every Day Smoker    Packs/day: 1.00    Years: 43.00    Pack years: 43.00  . Smokeless tobacco: Never Used  Substance Use Topics  . Alcohol use: Yes    Family History  Problem Relation Age of Onset  . Depression Mother   . Cancer Maternal Aunt   . Stroke Paternal Uncle   . Cancer Maternal Grandmother      Current medication list  and allergy/intolerance information reviewed:    Current Outpatient Medications  Medication Sig Dispense Refill  . ASHWAGANDHA PO Take by mouth.    Marland Kitchen aspirin 81 MG tablet Take 81 mg by mouth daily.    . COCONUT OIL PO Take by mouth.    Marland Kitchen KRILL OIL PO Take by mouth.    . Multiple Vitamin (MULTIVITAMIN) capsule Take 1 capsule by mouth daily.    . clobetasol cream (TEMOVATE) 0.05 % APPLY TO AFFECTED AREA TWICE DAILY FOR UP TO 2 WEEKS. NOT TO FACE.  1  . guaiFENesin-codeine (ROBITUSSIN AC) 100-10 MG/5ML syrup Take 5-10 mLs by mouth 4 (four) times daily as needed for cough. (Patient not taking: Reported on 06/11/2018) 180 mL 0  . ipratropium (ATROVENT) 0.06 % nasal spray Place 2 sprays into both nostrils 4 (four) times daily. (Patient not taking: Reported on 06/11/2018) 15 mL 1  . mupirocin ointment (BACTROBAN) 2 % APPLY TO OPEN SORES TWICE DAILY UNTIL HEALED  2   No current facility-administered medications for this visit.     No Known Allergies    Review of Systems:  Constitutional:  No  fever, no chills, No recent illness, No unintentional weight changes. No significant fatigue.   HEENT: No  headache, no vision change, no hearing change, No sore throat, No  sinus pressure  Cardiac: No  chest pain, No  pressure, No palpitations, No  Orthopnea  Respiratory:  No  shortness of  breath. No  Cough  Gastrointestinal: No  abdominal pain, No  nausea, No  vomiting,  No  blood in stool, No  diarrhea, No  constipation   Musculoskeletal: No new myalgia/arthralgia  Skin: No  Rash, No other wounds/concerning lesions  Genitourinary: No  incontinence, No  abnormal genital bleeding, No abnormal genital discharge  Hem/Onc: No  easy bruising/bleeding, No  abnormal lymph node  Endocrine: No cold intolerance,  No heat intolerance. No polyuria/polydipsia/polyphagia   Neurologic: No  weakness, No  dizziness, No  slurred speech/focal weakness/facial droop  Psychiatric: No  concerns with depression, No   concerns with anxiety, No sleep problems, No mood problems  Exam:  BP (!) 148/75 (BP Location: Left Arm, Patient Position: Sitting, Cuff Size: Normal)   Pulse 76   Temp 98.2 F (36.8 C) (Oral)   Wt 164 lb 6.4 oz (74.6 kg)   BMI 22.30 kg/m   Constitutional: VS see above. General Appearance: alert, well-developed, well-nourished, NAD  Eyes: Normal lids and conjunctive, non-icteric sclera  Ears, Nose, Mouth, Throat: MMM, Normal external inspection ears/nares/mouth/lips/gums. TM normal bilaterally. Pharynx/tonsils no erythema, no exudate. Nasal mucosa normal.   Neck: No masses, trachea midline. No thyroid enlargement. No tenderness/mass appreciated. No lymphadenopathy  Respiratory: Normal respiratory effort. no wheeze, no rhonchi, no rales  Cardiovascular: S1/S2 normal, no murmur, no rub/gallop auscultated. RRR. No lower extremity edema.   Gastrointestinal: Nontender, no masses. No hepatomegaly, no splenomegaly. No hernia appreciated. Bowel sounds normal. Rectal exam deferred.   Musculoskeletal: Gait normal. No clubbing/cyanosis of digits.   Neurological: Normal balance/coordination. No tremor. No cranial nerve deficit on limited exam. Motor and sensation intact and symmetric. Cerebellar reflexes intact.   Skin: warm, dry, intact. No rash/ulcer. No concerning nevi or subq nodules on limited exam.    Psychiatric: Normal judgment/insight. Normal mood and affect. Oriented x3.     ASSESSMENT/PLAN: The primary encounter diagnosis was Annual physical exam. Diagnoses of Elevated blood pressure reading and Cardiac risk counseling were also pertinent to this visit.   The 10-year ASCVD risk score Denman George(Goff DC Montez HagemanJr., et al., 2013) is: 19.7%   Values used to calculate the score:     Age: [age] years     Sex: Male     Is Non-Hispanic African American: No     Diabetic: No     Tobacco smoker: Yes     Systolic Blood Pressure: 148 mmHg     Is BP treated: No     HDL Cholesterol: 32 mg/dL     Total  Cholesterol: 190 mg/dL     Patient Instructions  General Preventive Care  Most recent routine screening lipids/other labs: see attached  Everyone should have blood pressure checked once per year. Goal: 130/80 or less  Tobacco: don't! Please let me know if you need help quitting!  Alcohol: responsible moderation is ok for most adults - if you have concerns about your alcohol intake, please talk to me!   Exercise: as tolerated to reduce risk of cardiovascular disease and diabetes. Strength training will also prevent osteoporosis.   Mental health: if need for mental health care (medicines, counseling, other), or concerns about moods, please let me know!   Sexual health: if need for STD testing, or if concerns with libido/pain problems, please let me know!  Advanced Directive: Living Will and/or Healthcare Power of Attorney recommended for all adults, regardless of age or health.  Vaccines  Flu vaccine: recommended for almost everyone, every fall.   Shingles vaccine: Shingrix recommended after  age 19. Will put you on our clinic waiting list.   Pneumonia vaccines: Prevnar and Pneumovax recommended after age 59, or sooner if certain medical conditions/smoker.  Tetanus booster: Tdap recommended every 10 years. Due 09/2025.  Cancer screenings   Colon cancer screening: Colonoscopy repeat due 06/2019  Prostate cancer screening: PSA blood test annually age 22-71 - normal   Lung cancer screening: CT chest every year for those age 39 to 58 years old with ?30 pack year smoking history, who either currently smoke or have quit within the past 15 years. Continue current screenings!  Infection screenings . HIV, Gonorrhea/Chlamydia: screening as needed. HIV already done!  . Hepatitis C: recommended for anyone born 24-1965. Already done!  . TB: certain at-risk populations, or depending on work requirements and/or travel history Other . Bone Density Test: recommended for men at age 46,  sooner depending on risk factors . Abdominal Aortic Aneurysm: screening with ultrasound recommended once for men age 72-75 who have ever smoked             Visit summary with medication list and pertinent instructions was printed for patient to review. All questions at time of visit were answered - patient instructed to contact office with any additional concerns or updates. ER/RTC precautions were reviewed with the patient.    Please note: voice recognition software was used to produce this document, and typos may escape review. Please contact Dr. Lyn Hollingshead for any needed clarifications.     Follow-up plan: Return in about 1 year (around 06/11/2019) for annual physical and repeat labs - see me sooner if BP at home higher than 130/80.

## 2018-06-12 ENCOUNTER — Telehealth: Payer: Self-pay | Admitting: Osteopathic Medicine

## 2018-06-12 NOTE — Telephone Encounter (Signed)
Added

## 2018-06-12 NOTE — Telephone Encounter (Signed)
-----   Message from Sunnie Nielsen, DO sent at 06/11/2018  4:14 PM EDT ----- Regarding: shingrix Shingrix

## 2018-07-15 ENCOUNTER — Other Ambulatory Visit: Payer: Self-pay | Admitting: Osteopathic Medicine

## 2018-08-10 ENCOUNTER — Other Ambulatory Visit: Payer: Self-pay | Admitting: Osteopathic Medicine

## 2018-08-10 NOTE — Telephone Encounter (Signed)
Please review

## 2019-01-14 ENCOUNTER — Ambulatory Visit (INDEPENDENT_AMBULATORY_CARE_PROVIDER_SITE_OTHER): Payer: BC Managed Care – PPO | Admitting: Osteopathic Medicine

## 2019-01-14 ENCOUNTER — Other Ambulatory Visit: Payer: Self-pay

## 2019-01-14 DIAGNOSIS — Z23 Encounter for immunization: Secondary | ICD-10-CM

## 2019-03-17 DIAGNOSIS — Z03818 Encounter for observation for suspected exposure to other biological agents ruled out: Secondary | ICD-10-CM | POA: Diagnosis not present

## 2019-03-17 DIAGNOSIS — Z20828 Contact with and (suspected) exposure to other viral communicable diseases: Secondary | ICD-10-CM | POA: Diagnosis not present

## 2019-11-21 ENCOUNTER — Ambulatory Visit (INDEPENDENT_AMBULATORY_CARE_PROVIDER_SITE_OTHER): Payer: BLUE CROSS/BLUE SHIELD | Admitting: Osteopathic Medicine

## 2019-11-21 ENCOUNTER — Other Ambulatory Visit: Payer: Self-pay

## 2019-11-21 VITALS — BP 171/117 | HR 91 | Ht 72.0 in | Wt 175.0 lb

## 2019-11-21 DIAGNOSIS — K5900 Constipation, unspecified: Secondary | ICD-10-CM

## 2019-11-21 DIAGNOSIS — I10 Essential (primary) hypertension: Secondary | ICD-10-CM | POA: Diagnosis not present

## 2019-11-21 NOTE — Progress Notes (Signed)
HPI: Jeffery Callahan is a 59 y.o. male who  has a past medical history of Emphysema lung ([place]) (06/09/2017), Lung nodule seen on imaging study (06/06/2016), and Positive FIT (fecal immunochemical test) (05/17/2016).  he presents to [facility] today, 11/21/19,  for chief complaint of: constipation  Patient reports two weeks of constipation. He states he has always had regular daily BMs in the morning, but in the last two weeks, his BMs have been smaller and he feels he isn't emptying his bowels completely. In the last few months, his lifestyle has changed, he has retired and is not up as early in the morning, and he has been home for meals more often. He tried suppositories twice, which did not help. He has been taking a daily stool softener and prune juice with powdered butter, which has helped some, but not fully resolved things. He states he had a colonoscopy in 2018 and had a couple polyps removed. He was due to follow up for a repeat colonoscopy this year, but he recently changed insurance and hasn't done it yet.  Past medical, surgical, social and family history reviewed:  Patient Active Problem List   Diagnosis Date Noted  . Superficial perivascular dermatitis 11/08/2017  . Elevated blood pressure reading 06/13/2017  . 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 Emphysema lung ([place]) 06/09/2017  . Aortic atherosclerosis ([place]) 06/09/2017  . Lung nodule seen on imaging study 06/06/2016  . Positive FIT (fecal immunochemical test) 05/17/2016  . Tobacco abuse 04/28/2016  . Personal history of tobacco use, presenting hazards to health 04/28/2016  . Rash and nonspecific skin eruption 10/22/2015  . Blisters of multiple sites 09/23/2015  . Infected sebaceous cyst 08/06/2015    Past Surgical History:  Procedure Laterality Date  . HAND SURGERY Left   . SHOULDER SURGERY Right   . TONSILLECTOMY      Social History   Tobacco Use  . Smoking status: Current Every  Day Smoker    Packs/day: 1.00    Years: 43.00    Pack years: 43.00  . Smokeless tobacco: Never Used  Substance Use Topics  . Alcohol use: Yes    Family History  Problem Relation Age of Onset  . Depression Mother   . Cancer Maternal Aunt   . Stroke Paternal Uncle   . Cancer Maternal Grandmother      Current medication list and allergy/intolerance information reviewed:    Current Outpatient Medications  Medication Sig Dispense Refill  . ASHWAGANDHA PO Take by mouth.    Marland Kitchen aspirin 81 MG tablet Take 81 mg by mouth daily.    . clobetasol cream (TEMOVATE) 0.05 % APPLY TO AFFECTED AREA TWICE DAILY FOR UP TO 2 WEEKS. NOT TO FACE.  1  . COCONUT OIL PO Take by mouth.    Marland Kitchen ipratropium (ATROVENT) 0.06 % nasal spray PLACE 2 SPRAYS INTO BOTH NOSTRILS 4 (FOUR) TIMES DAILY. 15 mL 1  . KRILL OIL PO Take by mouth.    . Multiple Vitamin (MULTIVITAMIN) capsule Take 1 capsule by mouth daily.    . mupirocin ointment (BACTROBAN) 2 % APPLY TO OPEN SORES TWICE DAILY UNTIL HEALED  2   No current facility-administered medications for this visit.    No Known Allergies    Review of Systems:  Constitutional:  No  fever, no chills, No unintentional weight changes.   HEENT: No  headache  Cardiac: No  chest pain  Respiratory:  No  shortness of breath. No  Cough  Gastrointestinal: No  abdominal pain, No  nausea, No  vomiting,  No  blood in stool, No  diarrhea, +  constipation   Musculoskeletal: No new myalgia/arthralgia  Skin: No  Rash, No other wounds/concerning lesions   Neurologic: No  weakness, No  dizziness  Psychiatric: No  concerns with depression  Exam:  BP (!) 171/117   Pulse 91   Ht 6' (1.829 m)   Wt 175 lb (79.4 kg)   SpO2 99%   BMI 23.73 kg/m   Constitutional: VS see above. General Appearance: alert, well-developed, well-nourished, NAD  Eyes: Normal lids and conjunctive, non-icteric sclera  Neck: No masses, trachea midline.   Respiratory: Normal respiratory effort. no  wheeze, no rhonchi, no rales  Cardiovascular: S1/S2 normal, no murmur, no rub/gallop auscultated. RRR.  Gastrointestinal: Nontender, no masses. No hepatomegaly, no splenomegaly. Bowel sounds normal. Rectal exam deferred.   Musculoskeletal: Gait normal. No clubbing/cyanosis of digits.   Neurological: Normal balance/coordination. No tremor. No cranial nerve deficit on limited exam.  Skin: warm, dry, intact. No rash/ulcer. No concerning nevi or subq nodules on limited exam.   Psychiatric: Normal judgment/insight. Normal mood and affect. Oriented x3.    No results found for this or any previous visit (from the past 72 hour(s)).  No results found.   ASSESSMENT/PLAN: The primary encounter diagnosis was Constipation, unspecified constipation type. A diagnosis of Essential hypertension was also pertinent to this visit.   BP above goal - pt's SO is nurse, can check BP at home, pt reports will get back to [place] w/ numbers next few weeks   Constipation  No red flag symptoms concerning for obstruction.  Printed instructions for patient. Start with increasing dietary fiber, water intake, and exercise.  If lifestyle changes are not sufficient, advised patient try daily stool softening laxative (eg miralax). If this is not sufficient, can add a stimulant laxative PRN (eg senna). If nothing else, we can prescribe a bowel prep kit for the patient to take at home if needed.  Patient is due for repeat colonoscopy at this time. Sent referral to GI to schedule this.  Follow up as needed if symptoms worsen or do not improve.   No orders of the defined types were placed in this encounter.   No orders of the defined types were placed in this encounter.   Patient Instructions    UpToDate info on constipation reviewed, plan for increase fiber/exercise/hydration, supplement metamucil or similar, add miralax few days per week prn, add senna stimulant 1-2 times per week as needed. Consider Rx. Needs  GI referral for colonoscopy       Visit summary with medication list and pertinent instructions was printed for patient to review. All questions at time of visit were answered - patient instructed to contact office with any additional concerns or updates. ER/RTC precautions were reviewed with the patient.    Please note: voice recognition software was used to produce this document, and typos may escape review. Please contact Dr. Sheppard Coil for any needed clarifications.     Follow-up plan: Return if symptoms worsen or fail to improve / based on home BP over enxt 1 week.  Gweneth Dimitri, [facility] MS3

## 2019-11-22 ENCOUNTER — Encounter: Payer: Self-pay | Admitting: Osteopathic Medicine

## 2019-11-22 DIAGNOSIS — K5900 Constipation, unspecified: Secondary | ICD-10-CM | POA: Insufficient documentation

## 2019-12-30 ENCOUNTER — Telehealth: Payer: Self-pay

## 2019-12-30 ENCOUNTER — Other Ambulatory Visit: Payer: Self-pay | Admitting: Medical-Surgical

## 2019-12-30 DIAGNOSIS — Z1211 Encounter for screening for malignant neoplasm of colon: Secondary | ICD-10-CM

## 2019-12-30 DIAGNOSIS — K5900 Constipation, unspecified: Secondary | ICD-10-CM

## 2019-12-30 NOTE — Telephone Encounter (Signed)
Patient contacted office in regards to abdominal pain and constipation. He was seen August 19 and since then has not gotten better. He has added fiber and dietary supplements to his diets which gives no relief from constipation. Per Dr. Mardelle Matte last note a referral for GI was suppose to be put in for patient. I see no referral in pts. Chart. Should the patient be scheduled a Ov or video visit to further discuss.

## 2019-12-30 NOTE — Telephone Encounter (Signed)
Referral has been entered for GI for constipation evaluation and colonoscopy. Recommend following Dr. Mardelle Matte instructions to incorporate daily Miralax into his regimen. If this has not helped, recommend adding in Senna. If he feels it necessary, he is welcome to schedule an appointment.

## 2019-12-31 NOTE — Telephone Encounter (Signed)
Patient advised.

## 2020-01-03 ENCOUNTER — Encounter: Payer: Self-pay | Admitting: Gastroenterology

## 2020-01-03 ENCOUNTER — Telehealth: Payer: Self-pay

## 2020-01-03 NOTE — Telephone Encounter (Signed)
Patient called into office wanting to know about GI referral. Patient was given address and phone number to [facility] in [place]. Patient was also advised of Joy Jessup's advice to add miralax into his daily regiment for constipation and abdominal pain relief. Patient voiced his understanding with no questions at this time,.

## 2020-01-06 ENCOUNTER — Encounter: Payer: Self-pay | Admitting: Medical-Surgical

## 2020-01-06 ENCOUNTER — Telehealth: Payer: Self-pay

## 2020-01-06 NOTE — Telephone Encounter (Signed)
Patient called stating that the office he was referred to is booked out until 03/04/20. He says he feels he needs a sooner appointment. He would like to go to [facility] here in [place].

## 2020-01-08 NOTE — Telephone Encounter (Signed)
Sent referral to [facility] in [place] they will call and schedule with patient for good appointment time - CF

## 2020-01-17 DIAGNOSIS — Z86010 Personal history of colonic polyps: Secondary | ICD-10-CM | POA: Diagnosis not present

## 2020-01-17 DIAGNOSIS — K5900 Constipation, unspecified: Secondary | ICD-10-CM | POA: Diagnosis not present

## 2020-01-24 DIAGNOSIS — Z1211 Encounter for screening for malignant neoplasm of colon: Secondary | ICD-10-CM | POA: Diagnosis not present

## 2020-01-24 DIAGNOSIS — Z86010 Personal history of colonic polyps: Secondary | ICD-10-CM | POA: Diagnosis not present

## 2020-01-24 DIAGNOSIS — D123 Benign neoplasm of transverse colon: Secondary | ICD-10-CM | POA: Diagnosis not present

## 2020-01-24 LAB — HM COLONOSCOPY

## 2020-02-14 ENCOUNTER — Ambulatory Visit (INDEPENDENT_AMBULATORY_CARE_PROVIDER_SITE_OTHER): Payer: BLUE CROSS/BLUE SHIELD | Admitting: Osteopathic Medicine

## 2020-02-14 DIAGNOSIS — Z23 Encounter for immunization: Secondary | ICD-10-CM | POA: Diagnosis not present

## 2020-03-04 ENCOUNTER — Encounter: Payer: BLUE CROSS/BLUE SHIELD | Admitting: Gastroenterology

## 2020-04-20 ENCOUNTER — Encounter: Payer: Self-pay | Admitting: Osteopathic Medicine
